# Patient Record
Sex: Male | Born: 1950 | Race: White | Hispanic: No | Smoking: Former smoker
Health system: Southern US, Community
[De-identification: ages and names within clinical notes are randomized; demographics above are authoritative.]

## PROBLEM LIST (undated history)

## (undated) DIAGNOSIS — E7800 Pure hypercholesterolemia, unspecified: Secondary | ICD-10-CM

## (undated) DIAGNOSIS — I2510 Atherosclerotic heart disease of native coronary artery without angina pectoris: Secondary | ICD-10-CM

## (undated) DIAGNOSIS — M545 Low back pain: Secondary | ICD-10-CM

## (undated) DIAGNOSIS — L309 Dermatitis, unspecified: Secondary | ICD-10-CM

## (undated) DIAGNOSIS — I219 Acute myocardial infarction, unspecified: Secondary | ICD-10-CM

## (undated) DIAGNOSIS — G8929 Other chronic pain: Secondary | ICD-10-CM

## (undated) HISTORY — PX: HERNIA REPAIR: SHX51

## (undated) HISTORY — PX: COLONOSCOPY: SHX174

## (undated) HISTORY — PX: TONSILLECTOMY: SUR1361

---

## 2014-04-30 HISTORY — PX: CARDIAC CATHETERIZATION: SHX172

## 2016-10-01 ENCOUNTER — Ambulatory Visit: Payer: Self-pay | Admitting: Physician Assistant

## 2016-11-02 ENCOUNTER — Encounter (HOSPITAL_COMMUNITY): Payer: Self-pay

## 2016-11-02 ENCOUNTER — Encounter (HOSPITAL_COMMUNITY)
Admission: RE | Admit: 2016-11-02 | Discharge: 2016-11-02 | Disposition: A | Discharge status: Home / Self Care | Payer: Worker's Compensation | Source: Ambulatory Visit | Attending: Orthopedic Surgery | Admitting: Orthopedic Surgery

## 2016-11-02 DIAGNOSIS — I2510 Atherosclerotic heart disease of native coronary artery without angina pectoris: Secondary | ICD-10-CM | POA: Insufficient documentation

## 2016-11-02 DIAGNOSIS — E785 Hyperlipidemia, unspecified: Secondary | ICD-10-CM | POA: Diagnosis not present

## 2016-11-02 DIAGNOSIS — Z79899 Other long term (current) drug therapy: Secondary | ICD-10-CM | POA: Diagnosis not present

## 2016-11-02 DIAGNOSIS — Z87891 Personal history of nicotine dependence: Secondary | ICD-10-CM | POA: Diagnosis not present

## 2016-11-02 DIAGNOSIS — Z01810 Encounter for preprocedural cardiovascular examination: Secondary | ICD-10-CM | POA: Diagnosis not present

## 2016-11-02 DIAGNOSIS — Z01812 Encounter for preprocedural laboratory examination: Secondary | ICD-10-CM | POA: Diagnosis not present

## 2016-11-02 HISTORY — DX: Low back pain: M54.5

## 2016-11-02 HISTORY — DX: Acute myocardial infarction, unspecified: I21.9

## 2016-11-02 HISTORY — DX: Other chronic pain: G89.29

## 2016-11-02 HISTORY — DX: Atherosclerotic heart disease of native coronary artery without angina pectoris: I25.10

## 2016-11-02 HISTORY — DX: Dermatitis, unspecified: L30.9

## 2016-11-02 HISTORY — DX: Pure hypercholesterolemia, unspecified: E78.00

## 2016-11-02 LAB — BASIC METABOLIC PANEL
ANION GAP: 5 (ref 5–15)
BUN: 12 mg/dL (ref 6–20)
CALCIUM: 9.1 mg/dL (ref 8.9–10.3)
CO2: 28 mmol/L (ref 22–32)
CREATININE: 0.92 mg/dL (ref 0.61–1.24)
Chloride: 106 mmol/L (ref 101–111)
GFR calc non Af Amer: >60 mL/min (ref >60)
Glucose, Bld: 95 mg/dL (ref 65–99)
Potassium: 4.2 mmol/L (ref 3.5–5.1)
Sodium: 139 mmol/L (ref 135–145)

## 2016-11-02 LAB — TYPE AND SCREEN
ABO/RH(D): A POS
Antibody Screen: NEGATIVE

## 2016-11-02 LAB — CBC
HEMATOCRIT: 44.1 % (ref 39.0–52.0)
HEMOGLOBIN: 14.8 g/dL (ref 13.0–17.0)
MCH: 32 pg (ref 26.0–34.0)
MCHC: 33.6 g/dL (ref 30.0–36.0)
MCV: 95.5 fL (ref 78.0–100.0)
Platelets: 270 10*3/uL (ref 150–400)
RBC: 4.62 MIL/uL (ref 4.22–5.81)
RDW: 13.3 % (ref 11.5–15.5)
WBC: 6.6 10*3/uL (ref 4.0–10.5)

## 2016-11-02 LAB — SURGICAL PCR SCREEN
MRSA, PCR: NEGATIVE
STAPHYLOCOCCUS AUREUS: NEGATIVE

## 2016-11-02 LAB — ABO/RH: ABO/RH(D): A POS

## 2016-11-02 NOTE — Progress Notes (Signed)
PCP - Ivor Messierhristopher Thomas Kelly Cardiologist - Dr. Almyra DeforestMcGurkin  Chest x-ray - not needed EKG - 11/02/16 Stress Test -requesting  ECHO - requesting Cardiac Cath - requesting  Sending to anesthesia for records     Patient denies shortness of breath, fever, cough and chest pain at PAT appointment   Patient verbalized understanding of instructions that were given to them at the PAT appointment. Patient was also instructed that they will need to review over the PAT instructions again at home before surgery.

## 2016-11-02 NOTE — Pre-Procedure Instructions (Signed)
    Zachary Wilkinson  11/02/2016     No Pharmacies Listed   Your procedure is scheduled on June 13  [facility] Admitting at [employer] A.M.  Call this number if you have problems the morning of surgery:  [phone]   Remember:  Do not eat food or drink liquids after midnight.   Take these medicines the morning of surgery with A SIP OF WATER  carvedilol (COREG), isosorbide mononitrate (IMDUR)   7 days prior to surgery STOP taking any Aspirin, Aleve, Naproxen, Ibuprofen, Motrin, Advil, Goody's, BC's, all herbal medications, fish oil, and all vitamins    Do not wear jewelry  Do not wear lotions, powders, or cologne, or deoderant.  Do not shave 48 hours prior to surgery.   Do not bring valuables to the hospital.  [facility] is not responsible for any belongings or valuables.  Contacts, dentures or bridgework may not be worn into surgery.  Leave your suitcase in the car.  After surgery it may be brought to your room.  For patients admitted to the hospital, discharge time will be determined by your treatment team.  Patients discharged the day of surgery will not be allowed to drive home.    Special instructions:   [place]- Preparing For Surgery  Before surgery, you can play an important role. Because skin is not sterile, your skin needs to be as free of germs as possible. You can reduce the number of germs on your skin by washing with CHG (chlorahexidine gluconate) Soap before surgery.  CHG is an antiseptic cleaner which kills germs and bonds with the skin to continue killing germs even after washing.  Please do not use if you have an allergy to CHG or antibacterial soaps. If your skin becomes reddened/irritated stop using the CHG.  Do not shave (including legs and underarms) for at least 48 hours prior to first CHG shower. It is OK to shave your face.  Please follow these instructions carefully.   1. Shower the NIGHT BEFORE SURGERY and the MORNING OF SURGERY  with CHG.   2. If you chose to wash your hair, wash your hair first as usual with your normal shampoo.  3. After you shampoo, rinse your hair and body thoroughly to remove the shampoo.  4. Use CHG as you would any other liquid soap. You can apply CHG directly to the skin and wash gently with a scrungie or a clean washcloth.   5. Apply the CHG Soap to your body ONLY FROM THE NECK DOWN.  Do not use on open wounds or open sores. Avoid contact with your eyes, ears, mouth and genitals (private parts). Wash genitals (private parts) with your normal soap.  6. Wash thoroughly, paying special attention to the area where your surgery will be performed.  7. Thoroughly rinse your body with warm water from the neck down.  8. DO NOT shower/wash with your normal soap after using and rinsing off the CHG Soap.  9. Pat yourself dry with a CLEAN TOWEL.   10. Wear CLEAN PAJAMAS   11. Place CLEAN SHEETS on your bed the night of your first shower and DO NOT SLEEP WITH PETS.    Day of Surgery: Do not apply any deodorants/lotions. Please wear clean clothes to the hospital/surgery center.      Please read over the following fact sheets that you were given.

## 2016-11-03 NOTE — Progress Notes (Signed)
Anesthesia Chart Review:  Patient is a 66 year old male scheduled for L3-5 XLIF, L3-S1 PSFI on 11/10/2016 with Venita Lickahari Brooks, MD  - PCP is Layla Mawhristopher Kelly, M.D. (notes in care everywhere)  - Cardiologist is Ginger CarneJames McGukin, MD who cleared pt for surgery at last office visit 10/04/16 (notes in care everywhere)   PMH includes:  CAD ([place] to [place]CA [place] hyperlipidemia. Former smoker. BMI 25.  Medications include: ASA 81 mg, Lipitor, carvedilol, Imdur  Preoperative labs 11/02/16 reviewed and acceptable for surgery.   EKG 11/02/16: NSR.   Exercise Treadmill Test 08/07/14 [facility] Normal stress test. ST segment change is within normal range.  If no changes, I anticipate pt can proceed with surgery as scheduled.   Rica Mastngela Ebert Forrester, FNP-BC [facility]/Anesthesiology Phone: [phone] 11/03/2016 2:42 PM

## 2016-11-09 MED ORDER — CEFAZOLIN SODIUM-DEXTROSE 2-4 GM/100ML-% IV SOLN
2.0000 g | INTRAVENOUS | Status: AC
  Administered 2016-11-10 (×2): 2 g via INTRAVENOUS
  Filled 2016-11-09: qty 100

## 2016-11-09 NOTE — Anesthesia Preprocedure Evaluation (Addendum)
Anesthesia Evaluation  Patient identified by MRN, date of birth, ID band Patient awake    Reviewed: Allergy & Precautions, NPO status , Patient's Chart, lab work & pertinent test results, reviewed documented beta blocker date and time   History of Anesthesia Complications Negative for: history of anesthetic complications  Airway Mallampati: II  TM Distance: >3 FB Neck ROM: Full    Dental  (+) Missing, Poor Dentition, Dental Advisory Given, Edentulous Lower   Pulmonary COPD, former smoker (quit 2015),    breath sounds clear to auscultation       Cardiovascular hypertension, Pt. on medications and Pt. on home beta blockers (-) angina+ CAD and + Cardiac Stents (DES to RCA 2015)   Rhythm:Regular Rate:Normal  '16 Stress test: normal without ST changes   Neuro/Psych Chronic back pain    GI/Hepatic negative GI ROS, Neg liver ROS,   Endo/Other  negative endocrine ROS  Renal/GU negative Renal ROS     Musculoskeletal   Abdominal   Peds  Hematology negative hematology ROS (+)   Anesthesia Other Findings   Reproductive/Obstetrics                            Anesthesia Physical Anesthesia Plan  ASA: III  Anesthesia Plan: General   Post-op Pain Management:    Induction: Intravenous  PONV Risk Score and Plan: 3 and Ondansetron, Dexamethasone, Propofol and Midazolam  Airway Management Planned: Oral ETT  Additional Equipment:   Intra-op Plan:   Post-operative Plan: Extubation in OR  Informed Consent: I have reviewed the patients History and Physical, chart, labs and discussed the procedure including the risks, benefits and alternatives for the proposed anesthesia with the patient or authorized representative who has indicated his/her understanding and acceptance.   Dental advisory given  Plan Discussed with: CRNA and Surgeon  Anesthesia Plan Comments: (Plan routine monitors, GETA)         Anesthesia Quick Evaluation

## 2016-11-10 ENCOUNTER — Encounter (HOSPITAL_COMMUNITY): Payer: Self-pay

## 2016-11-10 ENCOUNTER — Inpatient Hospital Stay (HOSPITAL_COMMUNITY)
Admission: RE | Admit: 2016-11-10 | Discharge: 2016-11-12 | DRG: 458 | Disposition: A | Discharge status: Home / Self Care | Payer: Worker's Compensation | Source: Ambulatory Visit | Attending: Orthopedic Surgery | Admitting: Orthopedic Surgery

## 2016-11-10 ENCOUNTER — Inpatient Hospital Stay (HOSPITAL_COMMUNITY): Payer: Worker's Compensation

## 2016-11-10 ENCOUNTER — Inpatient Hospital Stay (HOSPITAL_COMMUNITY): Payer: Worker's Compensation | Admitting: Emergency Medicine

## 2016-11-10 ENCOUNTER — Encounter (HOSPITAL_COMMUNITY)
Admission: RE | Disposition: A | Discharge status: Home / Self Care | Payer: Self-pay | Source: Ambulatory Visit | Attending: Orthopedic Surgery

## 2016-11-10 ENCOUNTER — Inpatient Hospital Stay (HOSPITAL_COMMUNITY): Payer: Worker's Compensation | Admitting: Anesthesiology

## 2016-11-10 DIAGNOSIS — G8929 Other chronic pain: Secondary | ICD-10-CM | POA: Diagnosis present

## 2016-11-10 DIAGNOSIS — M545 Low back pain: Secondary | ICD-10-CM | POA: Diagnosis present

## 2016-11-10 DIAGNOSIS — M5441 Lumbago with sciatica, right side: Secondary | ICD-10-CM | POA: Diagnosis present

## 2016-11-10 DIAGNOSIS — M549 Dorsalgia, unspecified: Secondary | ICD-10-CM | POA: Diagnosis present

## 2016-11-10 DIAGNOSIS — Z79899 Other long term (current) drug therapy: Secondary | ICD-10-CM

## 2016-11-10 DIAGNOSIS — M4186 Other forms of scoliosis, lumbar region: Secondary | ICD-10-CM | POA: Diagnosis present

## 2016-11-10 DIAGNOSIS — I2510 Atherosclerotic heart disease of native coronary artery without angina pectoris: Secondary | ICD-10-CM | POA: Diagnosis present

## 2016-11-10 DIAGNOSIS — E7800 Pure hypercholesterolemia, unspecified: Secondary | ICD-10-CM | POA: Diagnosis present

## 2016-11-10 DIAGNOSIS — Z419 Encounter for procedure for purposes other than remedying health state, unspecified: Secondary | ICD-10-CM

## 2016-11-10 DIAGNOSIS — M5137 Other intervertebral disc degeneration, lumbosacral region: Secondary | ICD-10-CM | POA: Diagnosis present

## 2016-11-10 DIAGNOSIS — M48062 Spinal stenosis, lumbar region with neurogenic claudication: Secondary | ICD-10-CM | POA: Diagnosis present

## 2016-11-10 DIAGNOSIS — Z7982 Long term (current) use of aspirin: Secondary | ICD-10-CM | POA: Diagnosis not present

## 2016-11-10 DIAGNOSIS — Z87891 Personal history of nicotine dependence: Secondary | ICD-10-CM | POA: Diagnosis not present

## 2016-11-10 DIAGNOSIS — M4326 Fusion of spine, lumbar region: Secondary | ICD-10-CM

## 2016-11-10 DIAGNOSIS — L309 Dermatitis, unspecified: Secondary | ICD-10-CM | POA: Diagnosis present

## 2016-11-10 DIAGNOSIS — I252 Old myocardial infarction: Secondary | ICD-10-CM

## 2016-11-10 DIAGNOSIS — R339 Retention of urine, unspecified: Secondary | ICD-10-CM | POA: Diagnosis not present

## 2016-11-10 DIAGNOSIS — M5136 Other intervertebral disc degeneration, lumbar region: Secondary | ICD-10-CM | POA: Diagnosis present

## 2016-11-10 HISTORY — PX: ANTERIOR LATERAL LUMBAR FUSION WITH PERCUTANEOUS SCREW 2 LEVEL: SHX5554

## 2016-11-10 SURGERY — ANTERIOR LATERAL LUMBAR FUSION WITH PERCUTANEOUS SCREW 2 LEVEL
Anesthesia: General | Site: Spine Lumbar

## 2016-11-10 MED ORDER — HEMOSTATIC AGENTS (NO CHARGE) OPTIME
TOPICAL | Status: DC | PRN
  Administered 2016-11-10 (×3): 1 via TOPICAL

## 2016-11-10 MED ORDER — MIDAZOLAM HCL 2 MG/2ML IJ SOLN
0.5000 mg | Freq: Once | INTRAMUSCULAR | Status: AC | PRN
  Administered 2016-11-10: 1 mg via INTRAVENOUS

## 2016-11-10 MED ORDER — BUPIVACAINE-EPINEPHRINE 0.25% -1:200000 IJ SOLN
INTRAMUSCULAR | Status: DC | PRN
  Administered 2016-11-10: 10 mL

## 2016-11-10 MED ORDER — MIDAZOLAM HCL 2 MG/2ML IJ SOLN
INTRAMUSCULAR | Status: AC
  Filled 2016-11-10: qty 2

## 2016-11-10 MED ORDER — METHOCARBAMOL 500 MG PO TABS
500.0000 mg | ORAL_TABLET | Freq: Four times a day (QID) | ORAL | Status: DC | PRN
  Administered 2016-11-10 – 2016-11-12 (×6): 500 mg via ORAL
  Filled 2016-11-10 (×6): qty 1

## 2016-11-10 MED ORDER — ACETAMINOPHEN 325 MG PO TABS
650.0000 mg | ORAL_TABLET | ORAL | Status: DC | PRN
  Administered 2016-11-11: 650 mg via ORAL
  Filled 2016-11-10: qty 2

## 2016-11-10 MED ORDER — PROMETHAZINE HCL 25 MG/ML IJ SOLN: 6.2500 mg | INTRAMUSCULAR | Status: DC | PRN

## 2016-11-10 MED ORDER — HYDROMORPHONE HCL 1 MG/ML IJ SOLN
0.2500 mg | INTRAMUSCULAR | Status: DC | PRN
  Administered 2016-11-10 (×3): 0.5 mg via INTRAVENOUS
  Administered 2016-11-10 (×2): 0.25 mg via INTRAVENOUS

## 2016-11-10 MED ORDER — OXYCODONE HCL 5 MG PO TABS
10.0000 mg | ORAL_TABLET | ORAL | Status: DC | PRN
  Administered 2016-11-10 – 2016-11-11 (×3): 10 mg via ORAL
  Filled 2016-11-10 (×3): qty 2

## 2016-11-10 MED ORDER — DEXAMETHASONE SODIUM PHOSPHATE 4 MG/ML IJ SOLN
4.0000 mg | Freq: Four times a day (QID) | INTRAMUSCULAR | Status: AC
  Administered 2016-11-10 – 2016-11-11 (×2): 4 mg via INTRAVENOUS
  Filled 2016-11-10 (×2): qty 1

## 2016-11-10 MED ORDER — PROPOFOL 10 MG/ML IV BOLUS
INTRAVENOUS | Status: DC | PRN
  Administered 2016-11-10: 130 mg via INTRAVENOUS

## 2016-11-10 MED ORDER — PROPOFOL 500 MG/50ML IV EMUL
INTRAVENOUS | Status: DC | PRN
  Administered 2016-11-10: 75 ug/kg/min via INTRAVENOUS

## 2016-11-10 MED ORDER — HYDROMORPHONE HCL 1 MG/ML IJ SOLN
INTRAMUSCULAR | Status: AC
  Filled 2016-11-10: qty 0.5

## 2016-11-10 MED ORDER — LIDOCAINE 2% (20 MG/ML) 5 ML SYRINGE
INTRAMUSCULAR | Status: DC | PRN
Start: 2016-11-10 — End: 2016-11-10
  Administered 2016-11-10: 80 mg via INTRAVENOUS

## 2016-11-10 MED ORDER — CEFAZOLIN SODIUM 1 G IJ SOLR
INTRAMUSCULAR | Status: AC
  Filled 2016-11-10: qty 20

## 2016-11-10 MED ORDER — DEXAMETHASONE 4 MG PO TABS
4.0000 mg | ORAL_TABLET | Freq: Four times a day (QID) | ORAL | Status: AC
  Administered 2016-11-11: 4 mg via ORAL
  Filled 2016-11-10: qty 1

## 2016-11-10 MED ORDER — FENTANYL CITRATE (PF) 250 MCG/5ML IJ SOLN
INTRAMUSCULAR | Status: AC
  Filled 2016-11-10: qty 5

## 2016-11-10 MED ORDER — POLYETHYLENE GLYCOL 3350 17 G PO PACK: 17.0000 g | PACK | Freq: Every day | ORAL | Status: DC | PRN

## 2016-11-10 MED ORDER — CEFAZOLIN SODIUM-DEXTROSE 2-4 GM/100ML-% IV SOLN
2.0000 g | Freq: Three times a day (TID) | INTRAVENOUS | Status: AC
  Administered 2016-11-10 – 2016-11-11 (×2): 2 g via INTRAVENOUS
  Filled 2016-11-10 (×2): qty 100

## 2016-11-10 MED ORDER — ONDANSETRON HCL 4 MG/2ML IJ SOLN: 4.0000 mg | Freq: Four times a day (QID) | INTRAMUSCULAR | Status: DC | PRN

## 2016-11-10 MED ORDER — METHOCARBAMOL 1000 MG/10ML IJ SOLN
500.0000 mg | Freq: Four times a day (QID) | INTRAVENOUS | Status: DC | PRN
  Filled 2016-11-10: qty 5

## 2016-11-10 MED ORDER — ONDANSETRON HCL 4 MG/2ML IJ SOLN
INTRAMUSCULAR | Status: AC
  Filled 2016-11-10: qty 2

## 2016-11-10 MED ORDER — MORPHINE SULFATE (PF) 4 MG/ML IV SOLN
2.0000 mg | INTRAVENOUS | Status: AC | PRN
  Administered 2016-11-10: 2 mg via INTRAVENOUS
  Filled 2016-11-10: qty 1

## 2016-11-10 MED ORDER — PROPOFOL 10 MG/ML IV BOLUS
INTRAVENOUS | Status: AC
  Filled 2016-11-10: qty 20

## 2016-11-10 MED ORDER — PHENOL 1.4 % MT LIQD: 1.0000 | OROMUCOSAL | Status: DC | PRN

## 2016-11-10 MED ORDER — DEXAMETHASONE SODIUM PHOSPHATE 10 MG/ML IJ SOLN
INTRAMUSCULAR | Status: AC
  Filled 2016-11-10: qty 1

## 2016-11-10 MED ORDER — MIDAZOLAM HCL 5 MG/5ML IJ SOLN
INTRAMUSCULAR | Status: DC | PRN
  Administered 2016-11-10: 2 mg via INTRAVENOUS

## 2016-11-10 MED ORDER — ISOSORBIDE MONONITRATE ER 60 MG PO TB24
60.0000 mg | ORAL_TABLET | Freq: Every day | ORAL | Status: DC
  Administered 2016-11-10 – 2016-11-12 (×3): 60 mg via ORAL
  Filled 2016-11-10 (×3): qty 1

## 2016-11-10 MED ORDER — SODIUM CHLORIDE 0.9% FLUSH: 3.0000 mL | INTRAVENOUS | Status: DC | PRN

## 2016-11-10 MED ORDER — ONDANSETRON HCL 4 MG/2ML IJ SOLN
INTRAMUSCULAR | Status: DC | PRN
  Administered 2016-11-10: 4 mg via INTRAVENOUS

## 2016-11-10 MED ORDER — 0.9 % SODIUM CHLORIDE (POUR BTL) OPTIME
TOPICAL | Status: DC | PRN
  Administered 2016-11-10 (×2): 1000 mL

## 2016-11-10 MED ORDER — MAGNESIUM CITRATE PO SOLN
0.5000 | Freq: Once | ORAL | Status: AC
  Administered 2016-11-11: 0.5 via ORAL
  Filled 2016-11-10: qty 296

## 2016-11-10 MED ORDER — EPHEDRINE SULFATE-NACL 50-0.9 MG/10ML-% IV SOSY
PREFILLED_SYRINGE | INTRAVENOUS | Status: DC | PRN
  Administered 2016-11-10: 10 mg via INTRAVENOUS
  Administered 2016-11-10: 5 mg via INTRAVENOUS

## 2016-11-10 MED ORDER — THROMBIN 20000 UNITS EX SOLR
CUTANEOUS | Status: AC
  Filled 2016-11-10: qty 20000

## 2016-11-10 MED ORDER — ATORVASTATIN CALCIUM 20 MG PO TABS
80.0000 mg | ORAL_TABLET | Freq: Every day | ORAL | Status: DC
  Administered 2016-11-10 – 2016-11-11 (×2): 80 mg via ORAL
  Filled 2016-11-10 (×2): qty 4

## 2016-11-10 MED ORDER — ACETAMINOPHEN 650 MG RE SUPP: 650.0000 mg | RECTAL | Status: DC | PRN

## 2016-11-10 MED ORDER — LIDOCAINE 2% (20 MG/ML) 5 ML SYRINGE
INTRAMUSCULAR | Status: AC
  Filled 2016-11-10: qty 5

## 2016-11-10 MED ORDER — HYDROXYZINE HCL 25 MG PO TABS: 50.0000 mg | ORAL_TABLET | Freq: Three times a day (TID) | ORAL | Status: DC | PRN

## 2016-11-10 MED ORDER — FENTANYL CITRATE (PF) 100 MCG/2ML IJ SOLN: INTRAMUSCULAR | Status: DC | PRN

## 2016-11-10 MED ORDER — ONDANSETRON HCL 4 MG PO TABS: 4.0000 mg | ORAL_TABLET | Freq: Four times a day (QID) | ORAL | Status: DC | PRN

## 2016-11-10 MED ORDER — MENTHOL 3 MG MT LOZG: 1.0000 | LOZENGE | OROMUCOSAL | Status: DC | PRN

## 2016-11-10 MED ORDER — SUCCINYLCHOLINE CHLORIDE 200 MG/10ML IV SOSY
PREFILLED_SYRINGE | INTRAVENOUS | Status: DC | PRN
Start: 2016-11-10 — End: 2016-11-10
  Administered 2016-11-10: 120 mg via INTRAVENOUS

## 2016-11-10 MED ORDER — LACTATED RINGERS IV SOLN
INTRAVENOUS | Status: DC
Start: 2016-11-10 — End: 2016-11-12

## 2016-11-10 MED ORDER — LACTATED RINGERS IV SOLN
INTRAVENOUS | Status: DC | PRN
  Administered 2016-11-10 (×3): via INTRAVENOUS

## 2016-11-10 MED ORDER — CARVEDILOL 3.125 MG PO TABS
3.1250 mg | ORAL_TABLET | Freq: Two times a day (BID) | ORAL | Status: DC
  Administered 2016-11-10 – 2016-11-12 (×4): 3.125 mg via ORAL
  Filled 2016-11-10 (×3): qty 1

## 2016-11-10 MED ORDER — FENTANYL CITRATE (PF) 250 MCG/5ML IJ SOLN
INTRAMUSCULAR | Status: DC | PRN
  Administered 2016-11-10: 100 ug via INTRAVENOUS
  Administered 2016-11-10: 150 ug via INTRAVENOUS
  Administered 2016-11-10: 50 ug via INTRAVENOUS

## 2016-11-10 MED ORDER — DEXAMETHASONE SODIUM PHOSPHATE 10 MG/ML IJ SOLN
INTRAMUSCULAR | Status: DC | PRN
  Administered 2016-11-10: 10 mg via INTRAVENOUS

## 2016-11-10 MED ORDER — BUPIVACAINE-EPINEPHRINE (PF) 0.25% -1:200000 IJ SOLN
INTRAMUSCULAR | Status: AC
  Filled 2016-11-10: qty 30

## 2016-11-10 MED ORDER — SODIUM CHLORIDE 0.9% FLUSH
3.0000 mL | Freq: Two times a day (BID) | INTRAVENOUS | Status: DC
Start: 2016-11-10 — End: 2016-11-12
  Administered 2016-11-11: 3 mL via INTRAVENOUS

## 2016-11-10 MED ORDER — PHENYLEPHRINE HCL 10 MG/ML IJ SOLN
INTRAMUSCULAR | Status: DC | PRN
  Administered 2016-11-10: 20 ug/min via INTRAVENOUS

## 2016-11-10 MED ORDER — PHENYLEPHRINE 40 MCG/ML (10ML) SYRINGE FOR IV PUSH (FOR BLOOD PRESSURE SUPPORT)
PREFILLED_SYRINGE | INTRAVENOUS | Status: DC | PRN
  Administered 2016-11-10 (×2): 80 ug via INTRAVENOUS
  Administered 2016-11-10: 40 ug via INTRAVENOUS
  Administered 2016-11-10: 80 ug via INTRAVENOUS

## 2016-11-10 MED ORDER — DOCUSATE SODIUM 100 MG PO CAPS
100.0000 mg | ORAL_CAPSULE | Freq: Two times a day (BID) | ORAL | Status: DC
  Administered 2016-11-10 – 2016-11-12 (×4): 100 mg via ORAL
  Filled 2016-11-10 (×4): qty 1

## 2016-11-10 MED ORDER — FLEET ENEMA 7-19 GM/118ML RE ENEM: 1.0000 | ENEMA | Freq: Once | RECTAL | Status: DC

## 2016-11-10 MED ORDER — PROPOFOL 1000 MG/100ML IV EMUL
INTRAVENOUS | Status: AC
  Filled 2016-11-10: qty 300

## 2016-11-10 MED ORDER — EPHEDRINE 5 MG/ML INJ
INTRAVENOUS | Status: AC
  Filled 2016-11-10: qty 10

## 2016-11-10 MED ORDER — MEPERIDINE HCL 25 MG/ML IJ SOLN: 6.2500 mg | INTRAMUSCULAR | Status: DC | PRN

## 2016-11-10 MED ORDER — PHENYLEPHRINE 40 MCG/ML (10ML) SYRINGE FOR IV PUSH (FOR BLOOD PRESSURE SUPPORT)
PREFILLED_SYRINGE | INTRAVENOUS | Status: AC
  Filled 2016-11-10: qty 10

## 2016-11-10 MED ORDER — CARVEDILOL 3.125 MG PO TABS
3.1250 mg | ORAL_TABLET | Freq: Two times a day (BID) | ORAL | Status: DC
  Administered 2016-11-10: 3.125 mg via ORAL
  Filled 2016-11-10 (×2): qty 1

## 2016-11-10 MED ORDER — SUCCINYLCHOLINE CHLORIDE 200 MG/10ML IV SOSY
PREFILLED_SYRINGE | INTRAVENOUS | Status: AC
  Filled 2016-11-10: qty 10

## 2016-11-10 SURGICAL SUPPLY — 114 items
APPLIER CLIP 11 MED OPEN (CLIP)
BLADE CLIPPER SURG (BLADE) IMPLANT
BLADE SURG 10 STRL SS (BLADE) ×6 IMPLANT
BONE MATRIX OSTEOCEL PRO MED (Bone Implant) ×6 IMPLANT
BONE VIVIGEN FORMABLE 5.4CC (Bone Implant) ×3 IMPLANT
BUR EGG ELITE 4.0 (BURR) ×2 IMPLANT
BUR EGG ELITE 4.0MM (BURR) ×1
CAGE MODULUS XL 12X18X55 - 10 (Cage) ×3 IMPLANT
CLIP APPLIE 11 MED OPEN (CLIP) IMPLANT
CLIP NEUROVISION LG (CLIP) ×3 IMPLANT
CLOSURE STERI-STRIP 1/2X4 (GAUZE/BANDAGES/DRESSINGS) ×3
CLOSURE WOUND 1/2 X4 (GAUZE/BANDAGES/DRESSINGS)
CLSR STERI-STRIP ANTIMIC 1/2X4 (GAUZE/BANDAGES/DRESSINGS) ×6 IMPLANT
CORDS BIPOLAR (ELECTRODE) ×6 IMPLANT
COUNTER NEEDLE 20 DBL MAG RED (NEEDLE) ×3 IMPLANT
COVER MAYO STAND STRL (DRAPES) ×6 IMPLANT
COVER SURGICAL LIGHT HANDLE (MISCELLANEOUS) ×9 IMPLANT
DERMABOND ADVANCED (GAUZE/BANDAGES/DRESSINGS)
DERMABOND ADVANCED .7 DNX12 (GAUZE/BANDAGES/DRESSINGS) IMPLANT
DRAPE C-ARM 42X72 X-RAY (DRAPES) ×6 IMPLANT
DRAPE C-ARMOR (DRAPES) ×6 IMPLANT
DRAPE INCISE IOBAN 66X45 STRL (DRAPES) ×6 IMPLANT
DRAPE LAPAROTOMY T 102X78X121 (DRAPES) ×6 IMPLANT
DRAPE LAPAROTOMY TRNSV 102X78 (DRAPE) ×3 IMPLANT
DRAPE ORTHO SPLIT 77X108 STRL (DRAPES) ×2
DRAPE POUCH INSTRU U-SHP 10X18 (DRAPES) ×3 IMPLANT
DRAPE SURG 17X23 STRL (DRAPES) ×3 IMPLANT
DRAPE SURG ORHT 6 SPLT 77X108 (DRAPES) ×1 IMPLANT
DRAPE U-SHAPE 47X51 STRL (DRAPES) ×6 IMPLANT
DRSG AQUACEL AG ADV 3.5X 6 (GAUZE/BANDAGES/DRESSINGS) ×9 IMPLANT
DRSG AQUACEL AG ADV 3.5X10 (GAUZE/BANDAGES/DRESSINGS) ×3 IMPLANT
DURAPREP 26ML APPLICATOR (WOUND CARE) ×6 IMPLANT
ELECT BLADE 4.0 EZ CLEAN MEGAD (MISCELLANEOUS) ×3
ELECT BLADE 6.5 EXT (BLADE) IMPLANT
ELECT CAUTERY BLADE 6.4 (BLADE) IMPLANT
ELECT PENCIL ROCKER SW 15FT (MISCELLANEOUS) ×6 IMPLANT
ELECT REM PT RETURN 9FT ADLT (ELECTROSURGICAL) ×3
ELECTRODE BLDE 4.0 EZ CLN MEGD (MISCELLANEOUS) ×1 IMPLANT
ELECTRODE REM PT RTRN 9FT ADLT (ELECTROSURGICAL) ×1 IMPLANT
GAUZE SPONGE 4X4 16PLY XRAY LF (GAUZE/BANDAGES/DRESSINGS) IMPLANT
GLOVE BIO SURGEON STRL SZ 6.5 (GLOVE) ×6 IMPLANT
GLOVE BIO SURGEONS STRL SZ 6.5 (GLOVE) ×3
GLOVE BIOGEL PI IND STRL 6.5 (GLOVE) ×1 IMPLANT
GLOVE BIOGEL PI IND STRL 8.5 (GLOVE) ×3 IMPLANT
GLOVE BIOGEL PI INDICATOR 6.5 (GLOVE) ×2
GLOVE BIOGEL PI INDICATOR 8.5 (GLOVE) ×6
GLOVE SS BIOGEL STRL SZ 8.5 (GLOVE) ×1 IMPLANT
GLOVE SUPERSENSE BIOGEL SZ 8.5 (GLOVE) ×2
GOWN STRL REUS W/ TWL LRG LVL3 (GOWN DISPOSABLE) ×1 IMPLANT
GOWN STRL REUS W/TWL 2XL LVL3 (GOWN DISPOSABLE) ×6 IMPLANT
GOWN STRL REUS W/TWL LRG LVL3 (GOWN DISPOSABLE) ×2
GUIDEWIRE NITINOL BEVEL TIP (WIRE) ×24 IMPLANT
KIT BASIN OR (CUSTOM PROCEDURE TRAY) ×3 IMPLANT
KIT DILATOR XLIF 5 (KITS) ×1 IMPLANT
KIT POSITION SURG JACKSON T1 (MISCELLANEOUS) ×3 IMPLANT
KIT ROOM TURNOVER OR (KITS) ×3 IMPLANT
KIT SURGICAL ACCESS MAXCESS 4 (KITS) ×3 IMPLANT
KIT XLIF (KITS) ×2
MODULE NVM5 NEXT GEN EMG (NEEDLE) ×3 IMPLANT
MODULUS XLW 12X22X55MM 10 (Spine Construct) ×3 IMPLANT
NEEDLE 22X1 1/2 (OR ONLY) (NEEDLE) ×3 IMPLANT
NEEDLE I-PASS III (NEEDLE) ×3 IMPLANT
NEEDLE SPNL 18GX3.5 QUINCKE PK (NEEDLE) ×3 IMPLANT
NS IRRIG 1000ML POUR BTL (IV SOLUTION) ×9 IMPLANT
PACK LAMINECTOMY ORTHO (CUSTOM PROCEDURE TRAY) ×3 IMPLANT
PACK UNIVERSAL I (CUSTOM PROCEDURE TRAY) ×6 IMPLANT
PAD ARMBOARD 7.5X6 YLW CONV (MISCELLANEOUS) ×6 IMPLANT
PATTIES SURGICAL .5 X.5 (GAUZE/BANDAGES/DRESSINGS) IMPLANT
PATTIES SURGICAL .5 X1 (DISPOSABLE) IMPLANT
POSITIONER HEAD PRONE TRACH (MISCELLANEOUS) ×3 IMPLANT
PROBE BALL TIP NVM5 SNG USE (BALLOONS) ×3 IMPLANT
PUTTY BONE DBX 5CC MIX (Putty) ×3 IMPLANT
PUTTY DBX 1CC (Putty) ×3 IMPLANT
PUTTY DBX 1CC DEPUY (Putty) ×1 IMPLANT
REDUCTION EXT RELINE MAS MOD (Neuro Prosthesis/Implant) ×12 IMPLANT
ROD RELINE MAS LORD 5.5X85MM (Rod) ×3 IMPLANT
ROD RELINE MAS LORD 5.5X90MM (Rod) ×3 IMPLANT
SCREW LOCK RELINE 5.5 TULIP (Screw) ×24 IMPLANT
SCREW RED MAS POLY 7.5X40MM (Screw) ×3 IMPLANT
SCREW RELINE RED 6.5X45MM POLY (Screw) ×15 IMPLANT
SCREW SHANK RELINE 7.5X40MM 2C (Screw) ×6 IMPLANT
SCREW SHANK RELINE MOD 7.5X45 (Screw) ×4 IMPLANT
SCREW SHANK RLINE MD 7.5X45 2C (Screw) ×2 IMPLANT
SLEEVE SURGEON STRL (DRAPES) ×3 IMPLANT
SPONGE INTESTINAL PEANUT (DISPOSABLE) ×3 IMPLANT
SPONGE LAP 4X18 X RAY DECT (DISPOSABLE) ×15 IMPLANT
SPONGE SURGIFOAM ABS GEL 100 (HEMOSTASIS) IMPLANT
STRIP CLOSURE SKIN 1/2X4 (GAUZE/BANDAGES/DRESSINGS) IMPLANT
SURGIFLO W/THROMBIN 8M KIT (HEMOSTASIS) ×3 IMPLANT
SUT BONE WAX W31G (SUTURE) ×3 IMPLANT
SUT MNCRL AB 3-0 PS2 18 (SUTURE) ×9 IMPLANT
SUT MON AB 3-0 SH 27 (SUTURE) ×2
SUT MON AB 3-0 SH27 (SUTURE) ×1 IMPLANT
SUT PROLENE 5 0 C 1 24 (SUTURE) IMPLANT
SUT SILK 2 0 TIES 10X30 (SUTURE) IMPLANT
SUT SILK 3 0 TIES 10X30 (SUTURE) IMPLANT
SUT VIC AB 1 CT1 18XCR BRD 8 (SUTURE) ×3 IMPLANT
SUT VIC AB 1 CT1 27 (SUTURE)
SUT VIC AB 1 CT1 27XBRD ANBCTR (SUTURE) IMPLANT
SUT VIC AB 1 CT1 8-18 (SUTURE) ×6
SUT VIC AB 1 CTX 36 (SUTURE)
SUT VIC AB 1 CTX36XBRD ANBCTR (SUTURE) IMPLANT
SUT VIC AB 2-0 CT1 18 (SUTURE) ×15 IMPLANT
SYR BULB IRRIGATION 50ML (SYRINGE) ×3 IMPLANT
SYR CONTROL 10ML LL (SYRINGE) ×9 IMPLANT
TAPE CLOTH 4X10 WHT NS (GAUZE/BANDAGES/DRESSINGS) ×6 IMPLANT
TOWEL OR 17X24 6PK STRL BLUE (TOWEL DISPOSABLE) ×3 IMPLANT
TOWEL OR 17X26 10 PK STRL BLUE (TOWEL DISPOSABLE) ×6 IMPLANT
TRAY FOLEY CATH SILVER 16FR (SET/KITS/TRAYS/PACK) ×3 IMPLANT
TRAY FOLEY W/METER SILVER 16FR (SET/KITS/TRAYS/PACK) ×3 IMPLANT
TUBE CONNECTING 12'X1/4 (SUCTIONS) ×1
TUBE CONNECTING 12X1/4 (SUCTIONS) ×2 IMPLANT
WATER STERILE IRR 1000ML POUR (IV SOLUTION) IMPLANT
YANKAUER SUCT BULB TIP NO VENT (SUCTIONS) ×6 IMPLANT

## 2016-11-10 NOTE — Anesthesia Postprocedure Evaluation (Signed)
Anesthesia Post Note  Patient: Zachary Wilkinson  Procedure(s) Performed: Procedure(s) (LRB): Extreme lumbar interbody fusion Lumbar 3-5 (N/A) Posterior Fusion  Lumbar 3-Sacral 1 (N/A)     Patient location during evaluation: PACU Anesthesia Type: General Level of consciousness: sedated, patient cooperative and oriented Pain management: pain level controlled Vital Signs Assessment: post-procedure vital signs reviewed and stable Respiratory status: spontaneous breathing, nonlabored ventilation, respiratory function stable and patient connected to nasal cannula oxygen Cardiovascular status: blood pressure returned to baseline and stable Postop Assessment: no signs of nausea or vomiting Anesthetic complications: no    Last Vitals:  Vitals:   11/10/16 1750 11/10/16 1814  BP:  111/67  Pulse:  100  Resp:  18  Temp: 37.1 C 36.4 C    Last Pain:  Vitals:   11/10/16 1715  TempSrc:   PainSc: Asleep      LLE Sensation: Full sensation (11/10/16 1834)   RLE Sensation: Full sensation (11/10/16 1834)      Erling CruzJACKSON,E. Chimene Salo

## 2016-11-10 NOTE — Brief Op Note (Signed)
11/10/2016  2:49 PM  PATIENT:  Zachary Wilkinson  66 y.o. male  PRE-OPERATIVE DIAGNOSIS:  Degenerative scoliosis with stenosis, DDD lumbar  POST-OPERATIVE DIAGNOSIS:  Degenerative scoliosis with stenosis, DDD lumbar  PROCEDURE:  Procedure(s): Extreme lumbar interbody fusion Lumbar 3-5 (N/A) Posterior Fusion  Lumbar 3-Sacral 1 (N/A)  SURGEON:  Surgeon(s) and Role:    Venita Lick* Aundraya Dripps, MD - Primary  PHYSICIAN ASSISTANT:   ASSISTANTS: carmen mayo   ANESTHESIA:   general  EBL:  Total I/O In: 2000 [I.V.:2000] Out: 750 [Urine:500; Blood:250]  BLOOD ADMINISTERED:none  DRAINS: none   LOCAL MEDICATIONS USED:  MARCAINE     SPECIMEN:  No Specimen  DISPOSITION OF SPECIMEN:  N/A  COUNTS:  YES  TOURNIQUET:  * No tourniquets in log *  DICTATION: .Other Dictation: Dictation Number [id number]  PLAN OF CARE: Admit to inpatient   PATIENT DISPOSITION:  PACU - hemodynamically stable.

## 2016-11-10 NOTE — H&P (Signed)
History of Present Illness The patient is a 66 year old male who comes in today for a preoperative History and Physical. The patient is scheduled for a XLIF L3-5, PSFI L3-S1 to be performed by Dr. Debria Garretahari D. Shon BatonBrooks, [facility] at [facility] on 11-10-16 . Please see the hospital record for complete dictated history and physical. the pt reports hx of a MI in 2015. Pt reports he stopped smoking after MI.   Problem List/Past Medical  Cervical pain (M54.2)  Acute bilateral low back pain with right-sided sciatica (M54.41)  Problems Reconciled   Allergies  No Known Drug Allergies  Allergies Reconciled   Family History  Cancer  Father. Cerebrovascular Accident  Father. Rheumatoid Arthritis  Father, Mother.  Social History  Tobacco use  Former smoker. Children  1 Current drinker  10/30/2015: Currently drinks beer only occasionally per week Current work status  working full time Living situation  live with partner Marital status  divorced No history of drug/alcohol rehab  Not under pain contract  Number of flights of stairs before winded  less than 1  Medication History  Atorvastatin Calcium (80MG  Tablet, Oral) Active. Carvedilol (3.125MG  Tablet, Oral) Active. Isosorbide Mononitrate ER (30MG  Tablet ER 24HR, Oral) Active. Aspirin (81MG  Tablet, Oral) Active. Medications Reconciled  Vitals  09/07/2016 9:37 AM Weight: 166 lb Height: 68in Body Surface Area: 1.89 m Body Mass Index: 25.24 kg/m  Pulse: 81 (Regular)  BP: 141/92 (Sitting, Left Arm, Standard) w/shoes smt General General Appearance-Not in acute distress. Orientation-Oriented X3. Build & Nutrition-Well nourished and Well developed.  Integumentary General Characteristics Surgical Scars - no surgical scar evidence of previous lumbar surgery. Lumbar Spine-Skin examination of the lumbar spine is without deformity, skin lesions, lacerations or abrasions.  Chest and Lung  Exam Auscultation Breath sounds - Normal and Clear.  Cardiovascular Auscultation Rhythm - Regular rate and rhythm.  Abdomen Palpation/Percussion Palpation and Percussion of the abdomen reveal - Soft, Non Tender and No Rebound tenderness.  Peripheral Vascular Lower Extremity Palpation - Posterior tibial pulse - Bilateral - 2+. Dorsalis pedis pulse - Bilateral - 2+.  Neurologic Sensation Lower Extremity - Bilateral - sensation is intact in the lower extremity. Reflexes Patellar Reflex - Bilateral - 2+. Achilles Reflex - Bilateral - 2+. Testing Seated Straight Leg Raise - Bilateral - Seated straight leg raise negative.  Musculoskeletal Spine/Ribs/Pelvis  Lumbosacral Spine: Inspection and Palpation - Tenderness - left lumbar paraspinals tender to palpation and right lumbar paraspinals tender to palpation. Strength and Tone: Strength - Hip Flexion - Bilateral - 5/5. Knee Extension - Bilateral - 5/5. Knee Flexion - Bilateral - 5/5. Ankle Dorsiflexion - Bilateral - 5/5. Ankle Plantarflexion - Bilateral - 5/5. Heel walk - Bilateral - able to heel walk without difficulty. Toe Walk - Bilateral - able to walk on toes without difficulty. Heel-Toe Walk - Bilateral - able to heel-toe walk without difficulty. ROM - Flexion - moderately decreased range of motion and painful. Extension - moderately decreased range of motion and painful. Left Lateral Bending - moderately decreased range of motion and painful. Right Lateral Bending - moderately decreased range of motion and painful. Right Rotation - moderately decreased range of motion and painful. Left Rotation - full range of motion. Pain - neither flexion or extension is more painful than the other. Lumbosacral Spine - Waddell's Signs - no Waddell's signs present. Lower Extremity Range of Motion - No true hip, knee or ankle pain with range of motion. Gait and Station - BankerAssistive Devices - no assistive  devices.    Objective Transcription On clinical  exam, he continues to have horrific back pain radiating into the buttock and down into both legs. His principal problem is the back pain though. He has loss of motion. He has pain with palpation. He has no focal motor deficits, 5/5 strength in the lower extremities. Intermittent dysesthesias in the buttock and hamstring and quad region. No hip flexor weakness. No hip, knee, or ankle pain with joint range of motion. No loss of bowel or bladder control.  RADIOGRAPHS Upon evaluation of his new x-rays today 09/07/2016, his pelvic incidence to lumbar lordosis is 15.1. This is greater than the acceptable 13 degrees. This indicates that his sagittal balance is quite poor and is contributing to his overall pain syndrome. It is my opinion that because of such an excessive pelvic incidence and lumbar lordosis ratio that doing a straightforward decompression alone would not adequately treat his pathology. In addition to the loss of his sagittal balance, he also has a scoliosis. This altered coronal balance is also contributing to his pain. He has approximately a 10 degree local kyphotic curve from L2 to L5.     Assessment & Plan Spinal stenosis of lumbar region with neurogenic claudication   Goal Of Surgery: Discussed that goal of surgery is to reduce pain and improve function and quality of life. Patient is aware that despite all appropriate treatment that there pain and function could be the same, worse, or different.  Lateral Lumbar Fusion: Risksof surgery include, but are not limited to: Death, stroke, paralysis, nerve root damage/injury, bleeding, blood clots, loss of bowel/bladder control, sexual dysfunction, retrograde ejaculation, hardware failure, or mal-position, spinal fluid leak, adjacent segment disease, non-union, need for further surgery, ongoing or worse pain, and injury to bladder, bowel and abdominal contents, infection. Injury to the lumbar plexus resulting in temporary or permanent hip  flexor weakness and difficulty walking. Need for supplemental posterior surgery including decompression and need for screw fixation.  Posterior decompression/Fusion:Risks of surgery include infection, bleeding, nerve damage, death, stroke, paralysis, failure to heal, need for further surgery, ongoing or worse pain, need for further surgery, CSF leak, loss of bowel or bladder, and recurrent disc herniation or stenosis which would necessitate need for further surgery. Non-union, hardware failure, adjacent segment disease and recurrent pain. Hardware breakage, mal-position requiring surgery to correct or remove.  Plans Transcription At this point, my concern with a straightforward decompression is that the sagittal balance will not improve and his coronal scoliosis deformity will worsen because of the decompression. Given these factors, I think the best course of action is the instrumented fusion. The L3-4 and L4-5 interbody fixation would allow to address the lateral recess and central stenosis. It allows [place] to address the coronal and sagittal imbalance and we will give him the best chance of a better outcome. Again, I would recommend the XLIF at L3-4 and L4-5 with posterior L3 to S1 instrumented fusion. I have reviewed the risks with him, which include infection, bleeding, nerve damage, death, stroke, paralysis, failure to heal, need for further surgery, ongoing or worse pain, loss of fixation, nonunion, need for further surgery, injury to the lumbar plexus, which can create hip weakness and difficulty ambulating. All of his questions were addressed. We will plan on moving forward with surgery in the very near future.

## 2016-11-10 NOTE — Transfer of Care (Signed)
Immediate Anesthesia Transfer of Care Note  Patient: Zachary Wilkinson  Procedure(s) Performed: Procedure(s): Extreme lumbar interbody fusion Lumbar 3-5 (N/A) Posterior Fusion  Lumbar 3-Sacral 1 (N/A)  Patient Location: PACU  Anesthesia Type:General  Level of Consciousness: drowsy and patient cooperative  Airway & Oxygen Therapy: Patient Spontanous Breathing and Patient connected to face mask oxygen  Post-op Assessment: Report given to RN, Post -op Vital signs reviewed and stable and Patient moving all extremities  Post vital signs: Reviewed and stable  Last Vitals:  Vitals:   11/10/16 0644  BP: (!) 148/73  Pulse: 69  Resp: 20  Temp: 36.8 C    Last Pain:  Vitals:   11/10/16 0644  TempSrc: Oral  PainSc: 6       Patients Stated Pain Goal: 3 (11/10/16 0644)  Complications: No apparent anesthesia complications

## 2016-11-10 NOTE — Anesthesia Procedure Notes (Signed)
Procedure Name: Intubation Date/Time: 11/10/2016 8:40 AM Performed by: Freddie Breech Pre-anesthesia Checklist: Patient identified, Emergency Drugs available, Suction available and Patient being monitored Patient Re-evaluated:Patient Re-evaluated prior to inductionOxygen Delivery Method: Circle System Utilized Preoxygenation: Pre-oxygenation with 100% oxygen Intubation Type: IV induction Ventilation: Mask ventilation without difficulty Laryngoscope Size: Mac and 4 Grade View: Grade I Tube type: Oral Number of attempts: 1 Airway Equipment and Method: Stylet and Oral airway Placement Confirmation: ETT inserted through vocal cords under direct vision,  positive ETCO2 and breath sounds checked- equal and bilateral Secured at: 23 cm Tube secured with: Tape Dental Injury: Teeth and Oropharynx as per pre-operative assessment

## 2016-11-11 ENCOUNTER — Inpatient Hospital Stay (HOSPITAL_COMMUNITY): Payer: Worker's Compensation

## 2016-11-11 ENCOUNTER — Encounter (HOSPITAL_COMMUNITY): Payer: Self-pay | Admitting: Orthopedic Surgery

## 2016-11-11 MED ORDER — ONDANSETRON 4 MG PO TBDP: 4.0000 mg | ORAL_TABLET | Freq: Three times a day (TID) | ORAL | 0 refills | Status: AC | PRN

## 2016-11-11 MED ORDER — PANTOPRAZOLE SODIUM 40 MG PO TBEC
40.0000 mg | DELAYED_RELEASE_TABLET | Freq: Two times a day (BID) | ORAL | Status: DC
  Administered 2016-11-11 – 2016-11-12 (×3): 40 mg via ORAL
  Filled 2016-11-11 (×3): qty 1

## 2016-11-11 MED ORDER — METHOCARBAMOL 500 MG PO TABS: 500.0000 mg | ORAL_TABLET | Freq: Three times a day (TID) | ORAL | 0 refills | Status: AC

## 2016-11-11 MED ORDER — ALUM & MAG HYDROXIDE-SIMETH 200-200-20 MG/5ML PO SUSP
30.0000 mL | Freq: Four times a day (QID) | ORAL | Status: DC | PRN
  Administered 2016-11-11 – 2016-11-12 (×2): 30 mL via ORAL
  Filled 2016-11-11 (×2): qty 30

## 2016-11-11 MED ORDER — PANTOPRAZOLE SODIUM 40 MG PO TBEC: 40.0000 mg | DELAYED_RELEASE_TABLET | Freq: Two times a day (BID) | ORAL | Status: DC

## 2016-11-11 MED ORDER — TAMSULOSIN HCL 0.4 MG PO CAPS
0.4000 mg | ORAL_CAPSULE | Freq: Every day | ORAL | Status: DC
  Administered 2016-11-11 – 2016-11-12 (×2): 0.4 mg via ORAL
  Filled 2016-11-11 (×2): qty 1

## 2016-11-11 MED ORDER — OXYCODONE HCL 5 MG PO TABS
15.0000 mg | ORAL_TABLET | ORAL | Status: DC | PRN
  Administered 2016-11-11 – 2016-11-12 (×7): 15 mg via ORAL
  Filled 2016-11-11 (×7): qty 3

## 2016-11-11 MED ORDER — OXYCODONE HCL 15 MG PO TABS: 15.0000 mg | ORAL_TABLET | ORAL | 0 refills | Status: AC | PRN

## 2016-11-11 NOTE — Progress Notes (Signed)
Discussed the pts urinary retention with Dr. Shon BatonBrooks.  He wanted to test rectal tone and sensation to r/o cauda equina syndrome.  This was performed and the pt demonstrated good rectal tone and sensation.  I already started Flomax.  We will continue to monitor today.  Anette Riedelarmen Mayo, [facility]

## 2016-11-11 NOTE — Op Note (Signed)
Zachary Wilkinson, Zachary Wilkinson               ACCOUNT NO.:  [id number]  MEDICAL RECORD NO.:  [id number]  LOCATION:                                 FACILITY:  PHYSICIAN:  Jhaden Pizzuto D. Shon Baton, M.D. 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  OPERATIVE PROCEDURES: 1. XLIF L3-4, L4-5. 2. Posterior instrumented fusion, L3-S1 with posterior lateral     arthrodesis, L5-S1.  COMPLICATIONS:  None.  CONDITION:  Stable.  Intraoperative neuromonitoring throughout the case remained normal with no adverse abnormal neuromonitoring events.  IMPLANT SYSTEM USED:  NuVasive titanium XLIF cage Modulus XL 12 x 22 x 55 at L3-4 and the Modulus XL 12 x 18 x 55 at L4-5.  Both were packed with Osteocel and DBX.  Posteriorly, we used cannulated MIS pedicle screws 45 mm in length at L3, 4, and 5; 40 mm in length at S1; 6.5 diameter L3 and L4 screws; 7.5 diameter screws at L5 and S1; posterolateral arthrodesis completed with ViviGen and DBX mix.  FIRST ASSISTANT:  Anette Riedel, [place].  HISTORY:  This is a very pleasant 66 year old gentleman who has been having severe back, buttock, and bilateral neuropathic leg pain. Attempts at conservative management had failed to alleviate his symptoms and he continued to suffer.  Preoperative imaging demonstrated loss of normal sagittal balance with a preop pelvic incidence to lumbar lordosis of 15, significant spinal stenosis.  As a result of the failure of conservative management, we elected to proceed with surgery.  The patient had also had significant degenerative disk disease at L5-S1. After discussing all the risks and benefits of surgery, we elected to proceed with the aforementioned  procedure.  DESCRIPTION OF PROCEDURE:  The patient was brought to the operating room and placed supine on the operating table.  After successful induction of general anesthesia and endotracheal intubation, TEDs, SCDs, and a Foley were inserted, and the intraoperative neuromonitoring apparatus was applied.  He was then turned into the left lateral decubitus position (left side up).  Axillary roll was placed.  Bony prominences were well padded, and the patient was secured to the Skytron operative bed using tape.  Once he was firmly attached to the bed, I confirmed in the AP and lateral planes that we centered at the L4-5 and L3-4 levels.  The lateral aspect of the flank was then prepped and draped in a standard fashion.  Operative time-out was taken and the consent was reviewed and all other pertinent important data was reviewed.  Using x-ray, identified the midportion of the L4 vertebral body.  I then marked this out.  So, I mapped out the anterior and posterior margins of the L4 vertebral body.  I infiltrated the proposed skin incision site and made an incision.  Sharp dissection was carried out down to the fascia of the external oblique.  A second small incision was made one fingerbreadth posteriorly and I bluntly dissected down to the posterior aspect of the  retroperitoneum.  I then bluntly dissected into the retroperitoneum and I could now palpate the iliopsoas.  I then began dissecting in the retroperitoneum with my bluntly and then dissected through the undersurface of the external and internal oblique.  Once I could deliver my finger into the primary incision site, I placed the first dilating tube on it and brought it down to the surface of the psoas.  I stimulated the surface of the psoas to ensure that I was not compressing the plexus.  I then advanced the trocar through the psoas to the lateral aspect of the L4-5 disk space.  I made sure to be in the anterior third of the  disk space.  I then stimulated in all directions circumferentially to confirm that I was not compressing the lumbar plexus.  I then sequentially dilated up and with each dilatation, confirming that I was not endangering or compressing the lumbar plexus. There were no aberrant free-running EMGs throughout that portion of the case.  I then placed my working trocar down and then slowly maneuvered it posteriorly.  I made sure that I had a good cuff of muscle, so that I was not directly compressing or traumatizing the lumbar plexus.  Again, I stimulated and based on the stimulation, the lumbar plexus was posterior to my retraction blades.  At this point, I had excellent visualization of the lateral aspect of the disk space.  Once all the trocar was secured to the table and to the disk space, an annulotomy was performed and I removed all of the disk material using pituitary rongeurs and cutting curettes.  I then released the contralateral annulus with an osteotome.  At this point, I had removed all of the cartilaginous endplate and had adequate diskectomy.  I then trialed sequentially the implants, then elected to use the size 12 x 18 x 55 lordotic cage.  This provided excellent interbody positioning.  It also provided excellent distraction and correction of the scoliosis.  I obtained the graft, packed it with the allograft bone and then malleted into position.  I then released all of the retractors.  This portion of the case represented about 23 minutes of retraction time.  I was pleased and I then paused for about 5 minutes to decrease the risk of neurapraxia and lumbar plexus traction injury.  At this point, once I had allowed 5 minutes to pass, I confirmed hemostasis and then repositioned and then using the same technique I had used at L4-5, placed my retracting blades down onto the L3-4 disk.  Again, I made sure the lumbar plexus was posterior to my blade.  With the lateral aspect of the  disk space visualized, I again performed an annulotomy and then used curettes and Kerrison rongeurs to remove all of the disk material and a Cobb elevator to release the contralateral side.  At this time, once I had removed all of the cartilaginous endplate, I placed a 12 x 22 x 55, 10-degree lordotic cage.  Again, I was pleased with the overall distraction and repositioning.  Once this was complete, I then rechecked my pelvic incidence to lumbar lordosis.  Preoperatively, it was 15 and post XLIF, it was 0.  This was, in my estimation, excellent restoration of the sagittal imbalance.  At this point, I irrigated the wound copiously with normal saline, made sure hemostasis using bipolar electrocautery and closed the fascia with interrupted #1 Vicryl sutures, then a layer of 2-0 Vicryl sutures and 3-0 Monocryl.  The secondary  incision I made posteriorly was irrigated and closed in a similar fashion.  With the lateral surgery complete, the patient was returned into the supine position and the spine Jean Rosenthal frame was brought into the room. The patient was gently repositioned into the prone position onto the spine frame.  Again, all bony prominences were well padded and the back now was prepped and draped in a standard fashion.  A secondary time-out was taken again just to confirm that we were doing the posterior portion, which was an L3-S1 instrumented fusion with posterolateral arthrodesis at L5-S1.  At this point, with the patient now prepped and draped, an x-ray was brought into the field and I identified the L3 pedicle.  A small incision was made on the lateral aspect of this pedicle and I advanced the Jamshidi needle down to the lateral aspect of the pedicle.  I attached the neurostimulating device to the Jamshidi needle, and using the AP view, I advanced the Jamshidi needle down into the pedicle of L3. Once I was nearing the medial wall of that pedicle, I switched my view to the  lateral.  I was just beyond the posterior wall of the vertebral body confirming satisfactory position and trajectory of my Jamshidi.  I advanced it into the pedicle and then placed my guidepin.  The L3 pedicle was now properly cannulated.  I repeated this exact procedure at L4, L5, and S1 and on the contralateral side.  Once all 8 pedicles were properly cannulated, I then began the instrumentation __________ fusion. Pedicle screws were then sized and then I placed the pedicle screws 7.5 diameter 40 mm length at S1 and a 7.5 diameter 45 mm length at L5.  Both the screws had excellent purchase.  I directly stimulated both screws and there was no aberrant free-running EMGs with 40 milliamps of stimulation.  Once this was completed, I connected the retracting device to the L5 and the S1 screws, and I could now see the posterolateral aspect of the spine.  I gently mobilized the paraspinal muscles to expose the facet capsule at L4-5.  I completely disrupted this facet capsule using a curette and bur.  With the facet burred and the posterolateral aspect burred, I then packed the posterolateral corner and the facet with bone graft.  This was ViviGen with DBX mix.  Once the posterolateral L5-S1 gutter was packed, I then attached the polyaxial heads to the screws.  I then placed the percutaneous L4 and L3 screws. Again, all 8 screws were then directly stimulated and there was no aberrant free-running EMG activity with 40 milliamps of stimulation. Once this was completed, I then measured and then passed a rod.  On the left-hand side, I used 90 mm length rod.  On the right-hand side, I used an 85 mm length rod.  The rod was passed and made sure to pass through all 4 screws on each side.  I then secured the rod down with top locking nuts.  They were all then torqued off according to manufacture's standards.  Final x-rays were now taken, AP and lateral views, which demonstrated satisfactory position of  the XLIF cage as well as the posterior pedicle screw fixation at L3, L4, L5 and S1.  All wounds were then copiously irrigated with normal saline and then closed in a layered fashion with interrupted #1 Vicryl sutures, 2-0 Vicryl sutures, and a 3- 0 Monocryl.  Steri-Strips and dry dressings were applied, and the patient was ultimately extubated, transferred to the  PACU without incident.  At the end of the case, all needle and sponge counts were correct.  There were no adverse intraoperative events.     Tishara Pizano D. Shon Baton, M.D.   ______________________________ Donn Pierini. Shon Baton, M.D.    DDB/MEDQ  D:  11/10/2016  T:  11/10/2016  Job:  [id number]

## 2016-11-11 NOTE — Evaluation (Signed)
Occupational Therapy Evaluation and Discharge Patient Details Name: Zachary CalixRonnie Teasdale MRN: [id number] DOB: 03/05/1951 Today's Date: 11/11/2016    History of Present Illness Pt is a 66 y.o. male s/p L3-5 XLIF, L3-S1 Posterior fusion. No pertinent PMHx in chart.   Clinical Impression   Pt reports he was independent with ADL PTA. Currently pt overall min guard assist for functional mobility and min assist for LB ADL. All back, safety, and ADL education completed with pt. Pt planning to d/c home with 24/7 supervision from family. No further acute OT needs identified; signing off at this time. Please re-consult if needs change. Thank you for this referral.    Follow Up Recommendations  No OT follow up;Supervision/Assistance - 24 hour (initially)    Equipment Recommendations  3 in 1 bedside commode    Recommendations for Other Services PT consult     Precautions / Restrictions Precautions Precautions: Fall;Back Precaution Booklet Issued: Yes (comment) Precaution Comments: Educated pt and wife on back precautions Required Braces or Orthoses: Spinal Brace Spinal Brace: Lumbar corset Restrictions Weight Bearing Restrictions: No      Mobility Bed Mobility               General bed mobility comments: Pt OOB in bathroom upon arrival  Transfers Overall transfer level: Needs assistance Equipment used: Rolling walker (2 wheeled) Transfers: Sit to/from Stand Sit to Stand: Min guard         General transfer comment: Good hand placement and technique with RW. Min guard for safety    Balance Overall balance assessment: Needs assistance Sitting-balance support: No upper extremity supported;Feet supported Sitting balance-Leahy Scale: Good     Standing balance support: No upper extremity supported;During functional activity Standing balance-Leahy Scale: Fair                             ADL either performed or assessed with clinical judgement   ADL Overall ADL's :  Needs assistance/impaired Eating/Feeding: Set up;Sitting   Grooming: Supervision/safety;Standing Grooming Details (indicate cue type and reason): Educated on use of 2 cups for oral care Upper Body Bathing: Set up;Supervision/ safety;Sitting   Lower Body Bathing: Minimal assistance;Sit to/from stand   Upper Body Dressing : Set up;Supervision/safety;Sitting Upper Body Dressing Details (indicate cue type and reason): to don brace Lower Body Dressing: Minimal assistance;Sit to/from stand Lower Body Dressing Details (indicate cue type and reason): Pt with difficulty crossing foot over opposite knee. Educated on compensatory strategies. Wife planning to assist as needed Toilet Transfer: Min guard;Ambulation;BSC;RW Toilet Transfer Details (indicate cue type and reason): Educated on use of 3 in 1 over toilet   Toileting - Clothing Manipulation Details (indicate cue type and reason): Educated on proper technique for peri care without twisting and use of wet wipes Tub/ Shower Transfer: Min guard;Tub transfer;Ambulation;3 in 1 Tub/Shower Transfer Details (indicate cue type and reason): Educated on tub transfer technique and use of 3 in 1 as a seat. Pt able to demo simulated tub transfer in room with min guard assist Functional mobility during ADLs: Min guard;Rolling walker General ADL Comments: Educated pt on maintaining back precautions during functional activities, keeping frequently used items at counter top height, and frequent mobility throughout the day upon return home.     Vision         Perception     Praxis      Pertinent Vitals/Pain Pain Assessment: 0-10 Pain Score: 9  Pain Location: back Pain Descriptors / Indicators:  Aching;Sore Pain Intervention(s): Monitored during session;Repositioned     Hand Dominance     Extremity/Trunk Assessment Upper Extremity Assessment Upper Extremity Assessment: Overall WFL for tasks assessed   Lower Extremity Assessment Lower Extremity  Assessment: Defer to PT evaluation   Cervical / Trunk Assessment Cervical / Trunk Assessment: Other exceptions Cervical / Trunk Exceptions: s/p spinal sx   Communication Communication Communication: HOH   Cognition Arousal/Alertness: Awake/alert Behavior During Therapy: WFL for tasks assessed/performed Overall Cognitive Status: Within Functional Limits for tasks assessed                                     General Comments       Exercises     Shoulder Instructions      Home Living Family/patient expects to be discharged to:: Private residence Living Arrangements: Spouse/significant other Available Help at Discharge: Family;Available 24 hours/day Type of Home: House Home Access: Stairs to enter [employer] of Steps: 2   Home Layout: One level     Bathroom Shower/Tub: IT trainer: Standard     Home Equipment: None          Prior Functioning/Environment Level of Independence: Independent                 OT Problem List:        OT Treatment/Interventions:      OT Goals(Current goals can be found in the care plan section) Acute Rehab OT Goals Patient Stated Goal: return home OT Goal Formulation: All assessment and education complete, DC therapy  OT Frequency:     Barriers to D/C:            Co-evaluation              AM-PAC PT "6 Clicks" Daily Activity     Outcome Measure Help from another person eating meals?: None Help from another person taking care of personal grooming?: A Little Help from another person toileting, which includes using toliet, bedpan, or urinal?: A Little Help from another person bathing (including washing, rinsing, drying)?: A Little Help from another person to put on and taking off regular upper body clothing?: A Little Help from another person to put on and taking off regular lower body clothing?: A Little 6 Click Score: 19   End of Session Equipment Utilized  During Treatment: Rolling walker;Back brace Nurse Communication: Mobility status;Other (comment) (equipment needs)  Activity Tolerance: Patient tolerated treatment well Patient left: in chair;with call bell/phone within reach;with family/visitor present  OT Visit Diagnosis: Other abnormalities of gait and mobility (R26.89)                Time: 0802-0820 OT Time Calculation (min): 18 min Charges:  OT General Charges $OT Visit: 1 Procedure OT Evaluation $OT Eval Moderate Complexity: 1 Procedure G-Codes:     Lavra Imler A. Brett Albino, M.S., OTR/L Pager: [phone]  Gaye Alken 11/11/2016, 9:37 AM

## 2016-11-11 NOTE — Progress Notes (Addendum)
    Subjective: 1 Day Post-Op Procedure(s) (LRB): Extreme lumbar interbody fusion Lumbar 3-5 (N/A) Posterior Fusion  Lumbar 3-Sacral 1 (N/A) Patient reports pain as 9 on 0-10 scale.   Denies CP or SOB.  Pt having difficulty voiding. Positive flatus. Pt ambulating with use of walker  Objective: Vital signs in last 24 hours: Temp:  [97.5 F (36.4 C)-98.8 F (37.1 C)] 98.4 F (36.9 C) (06/14 0445) Pulse Rate:  [72-112] 92 (06/14 0445) Resp:  [8-21] 18 (06/14 0445) BP: (85-136)/(54-91) 133/81 (06/14 0445) SpO2:  [92 %-99 %] 95 % (06/14 0445)  Intake/Output from previous day: 06/13 0701 - 06/14 0700 In: 2300 [I.V.:2300] Out: 1925 [Urine:1675; Blood:250] Intake/Output this shift: No intake/output data recorded.  Labs: No results for input(s): HGB in the last 72 hours. No results for input(s): WBC, RBC, HCT, PLT in the last 72 hours. No results for input(s): NA, K, CL, CO2, BUN, CREATININE, GLUCOSE, CALCIUM in the last 72 hours. No results for input(s): LABPT, INR in the last 72 hours.  Physical Exam: Neurologically intact ABD soft Sensation intact distally Dorsiflexion/Plantar flexion intact Incision: no drainage Compartment soft  Assessment/Plan: 1 Day Post-Op Procedure(s) (LRB): Extreme lumbar interbody fusion Lumbar 3-5 (N/A) Posterior Fusion  Lumbar 3-Sacral 1 (N/A) Advance diet Up with therapy  Started Flomax  [facility], Baxter Kailarmen Christina for Dr. Venita Lickahari Hollyann [facility] [phone] 11/11/2016, 7:23 AM    Patient ID: Zachary Wilkinson, male   DOB: 04/09/1951, [age] y.o.   MRN: [id number]    Doing well Right thigh pain improving No evidence of cauda equina Hip flexor strength intact no evidence of plexus injury Continue mobilization Plan on d/c to home tomorrow

## 2016-11-11 NOTE — Evaluation (Signed)
Physical Therapy Evaluation Patient Details Name: Zachary Wilkinson MRN: [id number] DOB: 09/06/1950 Today's Date: 11/11/2016   History of Present Illness  Pt is a 66 y.o. male s/p L3-5 XLIF, L3-S1 Posterior fusion. No pertinent PMHx in chart.  Clinical Impression  Pt admitted with above diagnosis. Pt currently with functional limitations due to the deficits listed below (see PT Problem List). At the time of PT eval pt was able to perform transfers and ambulation with min guard assist grossly for balance support and safety. Pt will benefit from skilled PT to increase their independence and safety with mobility to allow discharge to the venue listed below.       Follow Up Recommendations No PT follow up;Supervision for mobility/OOB    Equipment Recommendations  Rolling walker with 5" wheels;3in1 (PT)    Recommendations for Other Services       Precautions / Restrictions Precautions Precautions: Fall;Back Precaution Booklet Issued: Yes (comment) Precaution Comments: Educated pt and wife on back precautions Required Braces or Orthoses: Spinal Brace Spinal Brace: Lumbar corset Restrictions Weight Bearing Restrictions: No      Mobility  Bed Mobility               General bed mobility comments: Pt OOB in chair upon arrival  Transfers Overall transfer level: Needs assistance Equipment used: Rolling walker (2 wheeled) Transfers: Sit to/from Stand Sit to Stand: Min guard         General transfer comment: Good hand placement and technique with RW. Min guard for safety  Ambulation/Gait Ambulation/Gait assistance: Min guard Ambulation Distance (Feet): 250 Feet Assistive device: Rolling walker (2 wheeled) Gait Pattern/deviations: Step-through pattern;Decreased stride length;Trunk flexed Gait velocity: Decreased Gait velocity interpretation: Below normal speed for age/gender General Gait Details: VC's for improved posture and general safety with the RW. Pt was able to ambulate  well with no unsteadiness or LOB noted while RW was being used.   Stairs Stairs: Yes Stairs assistance: Min guard Stair Management: One rail Right;Step to pattern;Forwards Number of Stairs: 3 (x2) General stair comments: VC's for sequencing and general safety.   Wheelchair Mobility    Modified Rankin (Stroke Patients Only)       Balance Overall balance assessment: Needs assistance Sitting-balance support: No upper extremity supported;Feet supported Sitting balance-Leahy Scale: Good     Standing balance support: No upper extremity supported;During functional activity Standing balance-Leahy Scale: Fair                               Pertinent Vitals/Pain Pain Assessment: 0-10 Pain Score: 8  Pain Location: back Pain Descriptors / Indicators: Aching;Sore Pain Intervention(s): Limited activity within patient's tolerance;Monitored during session;Repositioned    Home Living Family/patient expects to be discharged to:: Private residence Living Arrangements: Spouse/significant other Available Help at Discharge: Family;Available 24 hours/day Type of Home: House Home Access: Stairs to enter   [employer] Corporation[employer]trance Stairs-Number of Steps: 2 Home Layout: One level Home Equipment: None      Prior Function Level of Independence: Independent               Hand Dominance        Extremity/Trunk Assessment   Upper Extremity Assessment Upper Extremity Assessment: Defer to OT evaluation    Lower Extremity Assessment Lower Extremity Assessment: Generalized weakness    Cervical / Trunk Assessment Cervical / Trunk Assessment: Other exceptions Cervical / Trunk Exceptions: s/p spinal sx  Communication   Communication: HOH  Cognition Arousal/Alertness:  Awake/alert Behavior During Therapy: WFL for tasks assessed/performed Overall Cognitive Status: Within Functional Limits for tasks assessed                                        General Comments       Exercises     Assessment/Plan    PT Assessment Patient needs continued PT services  PT Problem List Decreased strength;Decreased range of motion;Decreased activity tolerance;Decreased balance;Decreased mobility;Decreased knowledge of use of DME;Decreased safety awareness;Decreased knowledge of precautions;Pain       PT Treatment Interventions DME instruction;Gait training;Stair training;Functional mobility training;Therapeutic activities;Therapeutic exercise;Neuromuscular re-education;Patient/family education    PT Goals (Current goals can be found in the Care Plan section)  Acute Rehab PT Goals Patient Stated Goal: return home PT Goal Formulation: With patient Time For Goal Achievement: 11/18/16 Potential to Achieve Goals: Good    Frequency Min 5X/week   Barriers to discharge        Co-evaluation               AM-PAC PT "6 Clicks" Daily Activity  Outcome Measure Difficulty turning over in bed (including adjusting bedclothes, sheets and blankets)?: A Little Difficulty moving from lying on back to sitting on the side of the bed? : A Little Difficulty sitting down on and standing up from a chair with arms (e.g., wheelchair, bedside commode, etc,.)?: A Little Help needed moving to and from a bed to chair (including a wheelchair)?: A Little Help needed walking in hospital room?: A Little Help needed climbing 3-5 steps with a railing? : A Little 6 Click Score: 18    End of Session Equipment Utilized During Treatment: Gait belt Activity Tolerance: Patient tolerated treatment well Patient left: in chair;with call bell/phone within reach Nurse Communication: Mobility status PT Visit Diagnosis: Unsteadiness on feet (R26.81);Pain    Time: 1610-9604 PT Time Calculation (min) (ACUTE ONLY): 19 min   Charges:   PT Evaluation $PT Eval Moderate Complexity: 1 Procedure     PT G Codes:        Zachary Wilkinson, PT, DPT Acute Rehabilitation Services Pager: [phone]    Zachary Wilkinson 11/11/2016, 10:38 AM

## 2016-11-12 ENCOUNTER — Encounter (HOSPITAL_COMMUNITY): Payer: Self-pay | Admitting: Orthopedic Surgery

## 2016-11-12 NOTE — Progress Notes (Signed)
Physical Therapy Treatment Patient Details Name: Zachary CalixRonnie Grindle MRN: [id number] DOB: 05/12/1951 Today's Date: 11/12/2016    History of Present Illness Pt is a 66 y.o. male s/p L3-5 XLIF, L3-S1 Posterior fusion. No pertinent PMHx in chart.    PT Comments    Pt demonstrated improved activity tolerance during today's session. Pt ambulated 33640ft with a RW and required VCs to maintain upright posture. PT provided education about safety with car transfers, and maintaining spinal precautions. Pt demonstrated good knowledge of spinal precautions and safety during mobility. Current plan of care remains appropriate.    Follow Up Recommendations  No PT follow up;Supervision - Intermittent     Equipment Recommendations  Rolling walker with 5" wheels    Recommendations for Other Services       Precautions / Restrictions Precautions Precautions: Back;Fall Precaution Comments: pt able to state 3/3 spinal precautions Required Braces or Orthoses: Spinal Brace Spinal Brace: Lumbar corset Restrictions Weight Bearing Restrictions: No    Mobility  Bed Mobility               General bed mobility comments: received in chair  Transfers Overall transfer level: Modified independent Equipment used: None Transfers: Sit to/from Stand Sit to Stand: Modified independent (Device/Increase time)         General transfer comment: Increased time due to pain  Ambulation/Gait Ambulation/Gait assistance: Supervision Ambulation Distance (Feet): 340 Feet Assistive device: Rolling walker (2 wheeled) Gait Pattern/deviations: Step-through pattern;Decreased stride length;Trunk flexed Gait velocity: decreased Gait velocity interpretation: Below normal speed for age/gender General Gait Details: Pt required VCs for upright posture during gait.    Stairs            Wheelchair Mobility    Modified Rankin (Stroke Patients Only)       Balance Overall balance assessment: Needs  assistance Sitting-balance support: No upper extremity supported;Feet supported Sitting balance-Leahy Scale: Good     Standing balance support: Bilateral upper extremity supported;During functional activity Standing balance-Leahy Scale: Fair                              Cognition Arousal/Alertness: Awake/alert Behavior During Therapy: WFL for tasks assessed/performed Overall Cognitive Status: Within Functional Limits for tasks assessed                                        Exercises      General Comments General comments (skin integrity, edema, etc.): Pt's wife present during session. PT provided edcuation about safety with car transfers and mobility, and maintaining spinal precautions      Pertinent Vitals/Pain Pain Assessment: Faces Faces Pain Scale: Hurts even more Pain Location: back Pain Descriptors / Indicators: Aching;Operative site guarding;Sore Pain Intervention(s): Monitored during session    Home Living                      Prior Function            PT Goals (current goals can now be found in the care plan section) Acute Rehab PT Goals Patient Stated Goal: to go home PT Goal Formulation: With patient Time For Goal Achievement: 11/18/16 Potential to Achieve Goals: Good Progress towards PT goals: Progressing toward goals    Frequency    Min 5X/week      PT Plan Current plan remains appropriate    Co-evaluation  AM-PAC PT "6 Clicks" Daily Activity  Outcome Measure  Difficulty turning over in bed (including adjusting bedclothes, sheets and blankets)?: None Difficulty moving from lying on back to sitting on the side of the bed? : None Difficulty sitting down on and standing up from a chair with arms (e.g., wheelchair, bedside commode, etc,.)?: None Help needed moving to and from a bed to chair (including a wheelchair)?: A Little Help needed walking in hospital room?: A Little Help needed  climbing 3-5 steps with a railing? : A Little 6 Click Score: 21    End of Session Equipment Utilized During Treatment: Gait belt;Back brace Activity Tolerance: Patient tolerated treatment well Patient left: in chair;with call bell/phone within reach;with family/visitor present Nurse Communication: Mobility status PT Visit Diagnosis: Unsteadiness on feet (R26.81);Pain Pain - part of body:  (back)     Time: 4098-1191 PT Time Calculation (min) (ACUTE ONLY): 11 min  Charges:  $Gait Training: 8-22 mins                    G Codes:       Corlis Leak, SPT  [phone]   Corlis Leak 11/12/2016, 9:59 AM

## 2016-11-12 NOTE — Progress Notes (Signed)
Patient is being discharged home. Discharge instructions were given to patient and family 

## 2016-11-12 NOTE — Progress Notes (Signed)
    Subjective: Procedure(s) (LRB): Extreme lumbar interbody fusion Lumbar 3-5 (N/A) Posterior Fusion  Lumbar 3-Sacral 1 (N/A) 2 Days Post-Op  Patient reports pain as 3 on 0-10 scale.  Reports decreased leg pain reports incisional back pain   Positive void Negative bowel movement Positive flatus Negative chest pain or shortness of breath  Objective: Vital signs in last 24 hours: Temp:  [97.7 F (36.5 C)-98.3 F (36.8 C)] 98.1 F (36.7 C) (06/15 0414) Pulse Rate:  [64-74] 73 (06/15 0414) Resp:  [18-20] 18 (06/15 0414) BP: (96-126)/(68-76) 111/76 (06/15 0414) SpO2:  [94 %-98 %] 94 % (06/15 0414)  Intake/Output from previous day: 06/14 0701 - 06/15 0700 In: 960 [P.O.:960] Out: 400 [Urine:400]  Labs: No results for input(s): WBC, RBC, HCT, PLT in the last 72 hours. No results for input(s): NA, K, CL, CO2, BUN, CREATININE, GLUCOSE, CALCIUM in the last 72 hours. No results for input(s): LABPT, INR in the last 72 hours.  Physical Exam: Neurologically intact ABD soft Intact pulses distally Incision: dressing C/D/I Compartment soft  Assessment/Plan: Patient stable  xrays satisfactory Continue mobilization with physical therapy Continue care  Advance diet Up with therapy  Plan on d/c to home today  Venita Lickahari Vielka Klinedinst, MD [facility] [phone]

## 2016-11-17 NOTE — Discharge Summary (Signed)
Physician Discharge Summary  Patient ID: Zachary Wilkinson MRN: [id number] DOB/AGE: [birth year] 64 y.o.  Admit date: 11/10/2016 Discharge date: 11/12/16  Admission Diagnoses:  <principal problem not specified>  Discharge Diagnoses:  Active Problems:   Back pain   Past Medical History:  Diagnosis Date  . Chronic low back pain   . Coronary artery disease   . Eczema   . High cholesterol   . Myocardial infarction [facility])     Surgeries: Procedure(s): Extreme lumbar interbody fusion Lumbar 3-5 Posterior Fusion  Lumbar 3-Sacral 1 on 11/10/2016   Consultants (if any):   Discharged Condition: Improved  Hospital Course: Zachary Wilkinson is an 66 y.o. male who was admitted 11/10/2016 with a diagnosis of Degenerative Lumbar Scoliosis and went to the operating room on 11/10/2016 and underwent the above named procedures.  Post op day one pts level of pain was high and he had urinary retention.  Post op day one pain level was low and controlled on oral medications.  He reports decreased leg pain. Pt is voiding. Pt is ambulating in hallway.  Pt is cleared by PT for DC.  Pt denies nausea.    He was given perioperative antibiotics:  Anti-infectives    Start     Dose/Rate Route Frequency Ordered Stop   11/10/16 2000  ceFAZolin (ANCEF) IVPB 2g/100 mL premix     2 g 200 mL/hr over 30 Minutes Intravenous Every 8 hours 11/10/16 1833 11/11/16 0406   11/10/16 0800  ceFAZolin (ANCEF) IVPB 2g/100 mL premix     2 g 200 mL/hr over 30 Minutes Intravenous To [facility] 11/09/16 1538 11/10/16 1305    .  He was given sequential compression devices, early ambulation, and TED for DVT prophylaxis.  He benefited maximally from the hospital stay and there were no complications.    Recent vital signs:  Vitals:   11/12/16 0414 11/12/16 0810  BP: 111/76 114/75  Pulse: 73 71  Resp: 18 18  Temp: 98.1 F (36.7 C) 97.6 F (36.4 C)    Recent laboratory studies:  Lab Results  Component Value Date   HGB  14.8 11/02/2016   Lab Results  Component Value Date   WBC 6.6 11/02/2016   PLT 270 11/02/2016   No results found for: INR Lab Results  Component Value Date   NA 139 11/02/2016   K 4.2 11/02/2016   CL 106 11/02/2016   CO2 28 11/02/2016   BUN 12 11/02/2016   CREATININE 0.92 11/02/2016   GLUCOSE 95 11/02/2016    Discharge Medications:   Allergies as of 11/12/2016      Reactions   No Known Allergies       Medication List    TAKE these medications   aspirin EC 81 MG tablet Take 81 mg by mouth daily.   atorvastatin 80 MG tablet Commonly known as:  LIPITOR Take 80 mg by mouth daily.   carvedilol 3.125 MG tablet Commonly known as:  COREG Take 3.125 mg by mouth 2 (two) times daily.   hydrOXYzine 50 MG tablet Commonly known as:  ATARAX/VISTARIL Take 50 mg by mouth every 8 (eight) hours as needed for itching.   isosorbide mononitrate 60 MG 24 hr tablet Commonly known as:  IMDUR Take 60 mg by mouth daily.   methocarbamol 500 MG tablet Commonly known as:  ROBAXIN Take 1 tablet (500 mg total) by mouth 3 (three) times daily.   ondansetron 4 MG disintegrating tablet Commonly known as:  ZOFRAN ODT Take 1 tablet (4  mg total) by mouth every 8 (eight) hours as needed for nausea or vomiting.   oxyCODONE 15 MG immediate release tablet Commonly known as:  ROXICODONE Take 1 tablet (15 mg total) by mouth every 4 (four) hours as needed for severe pain.   triamcinolone cream 0.1 % Commonly known as:  KENALOG Apply 1 application topically 2 (two) times daily as needed (for skin).       Diagnostic Studies: Dg Lumbar Spine 2-3 Views  Result Date: 11/11/2016 CLINICAL DATA:  66 year old male status post lumbar surgery yesterday. EXAM: LUMBAR SPINE - 2-3 VIEW COMPARISON:  Intraoperative fluoroscopic images of the lumbar spine yesterday. FINDINGS: Normal lumbar segmentation. Straightening of lumbar lordosis. Sequelae of L3 L4 and L4-L5 posterior and interbody fusion. Transpedicular  fusion hardware only at L5-S1. Hardware appears intact and placement appears stable since yesterday. No superimposed acute osseous abnormality identified. Severe disc space loss at L1-L2 (vacuum disc) and L5-S1 with endplate spurring. Visible lower thoracic levels appear intact. Sacral ala and SI joints appear normal. Calcified aortic atherosclerosis. IMPRESSION: 1. L3 through S1 fusion hardware placement, stable since yesterday with no adverse features. 2. Advanced L1-L2 disc and endplate degeneration. 3.  Calcified aortic atherosclerosis. Electronically Signed   By: Odessa FlemingH  Hall M.D.   On: 11/11/2016 08:01   Dg Lumbar Spine Complete  Result Date: 11/10/2016 CLINICAL DATA:  Status post surgical posterior fusion. EXAM: LUMBAR SPINE - COMPLETE 4+ VIEW; DG C-ARM GT 120 MIN FLUOROSCOPY TIME:  9 minutes 33 seconds. COMPARISON:  None. FINDINGS: Four intraoperative fluoroscopic images were obtained of the lower lumbar spine. These demonstrate the patient be status post surgical posterior fusion extending from L3-S1 with bilateral intrapedicular screw placement. Patient is also status post interbody fusion of L3-4 and L5-S1. Good alignment of vertebral bodies is noted. Atherosclerosis of thoracic aorta is noted. IMPRESSION: Status post surgical fusion extending from L3-S1. Electronically Signed   By: Lupita RaiderJames  Green Jr, M.D.   On: 11/10/2016 15:09   Dg C-arm Gt 120 Min  Result Date: 11/10/2016 CLINICAL DATA:  Status post surgical posterior fusion. EXAM: LUMBAR SPINE - COMPLETE 4+ VIEW; DG C-ARM GT 120 MIN FLUOROSCOPY TIME:  9 minutes 33 seconds. COMPARISON:  None. FINDINGS: Four intraoperative fluoroscopic images were obtained of the lower lumbar spine. These demonstrate the patient be status post surgical posterior fusion extending from L3-S1 with bilateral intrapedicular screw placement. Patient is also status post interbody fusion of L3-4 and L5-S1. Good alignment of vertebral bodies is noted. Atherosclerosis of  thoracic aorta is noted. IMPRESSION: Status post surgical fusion extending from L3-S1. Electronically Signed   By: Lupita RaiderJames  Green Jr, M.D.   On: 11/10/2016 15:09    Disposition: 01-Home or Self Care  The pt will present to clinic in 2 weeks Post op meds provided for the pt.  Discharge Instructions    Incentive spirometry RT    Complete by:  As directed       Follow-up Information    Venita LickBrooks, Dahari, MD Follow up.   Specialty:  Orthopedic Surgery Contact information: [address] [place] [address] [phone]            Signed: Kirt BoysMayo, Kiva Norland Christina 11/17/2016, 11:50 AM

## 2017-09-16 IMAGING — RF DG LUMBAR SPINE COMPLETE 4+V
1 series · 4 of 4 positions shown · non-contrast
Comparison: None.

CLINICAL DATA: Status post surgical posterior fusion.

EXAM:
LUMBAR SPINE - COMPLETE 4+ VIEW; DG C-ARM GT 120 MIN
FLUOROSCOPY TIME:  9 minutes 33 seconds.

[Series 1: run · 4 of 4 slices shown]
[im 1/4]
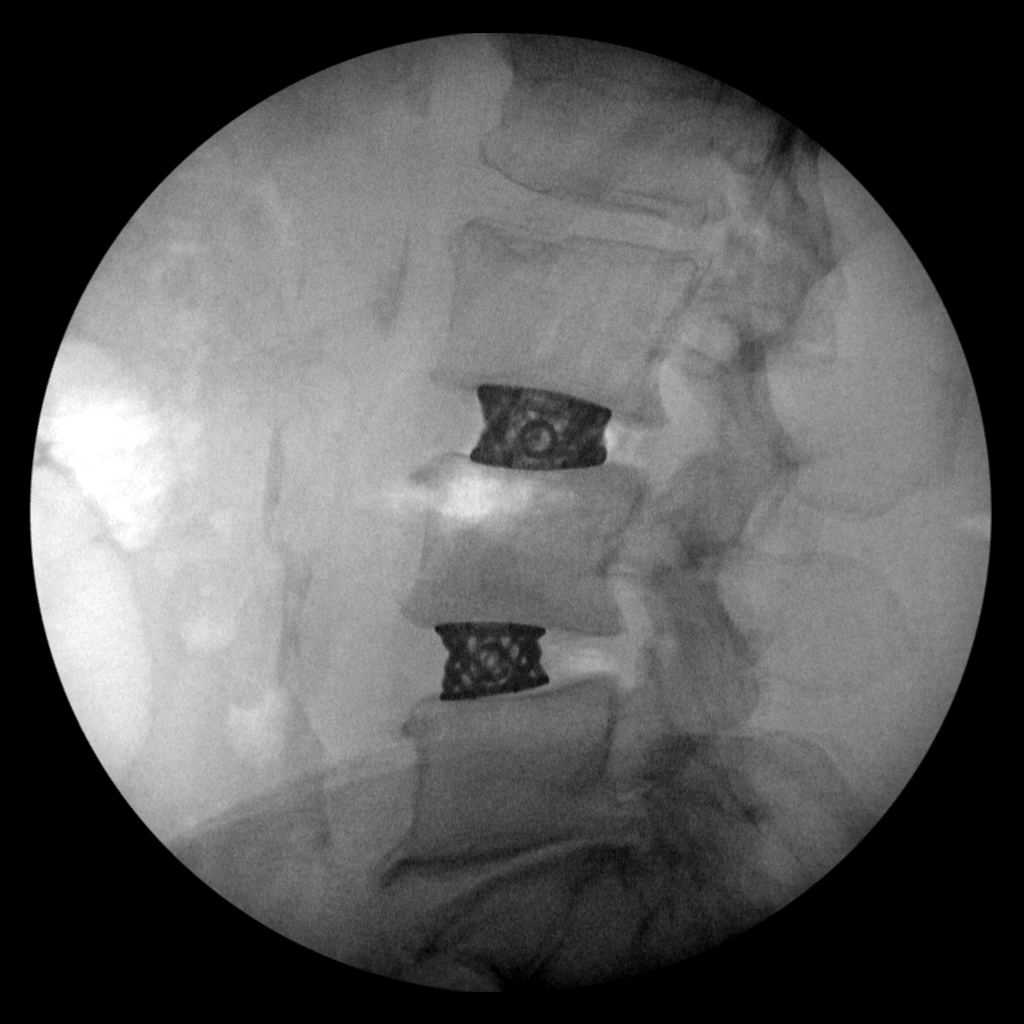
[im 2/4]
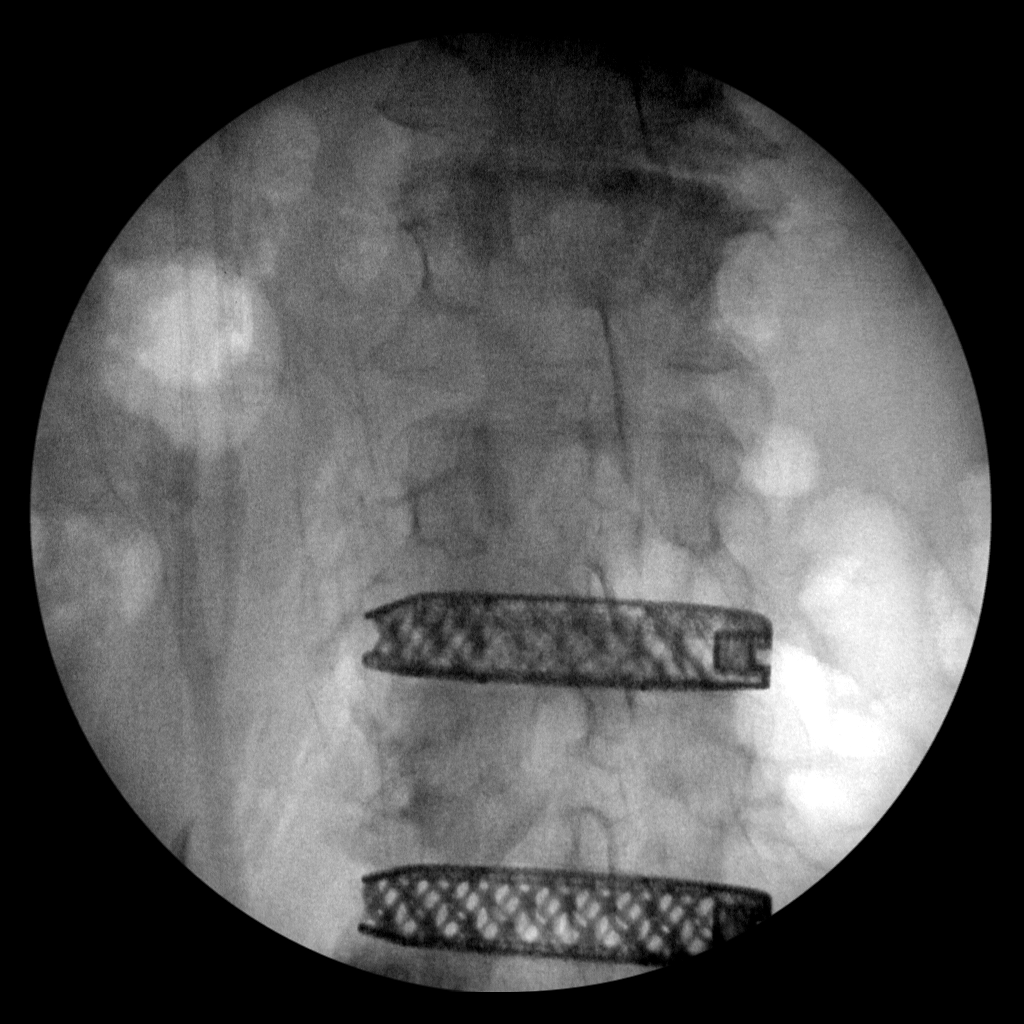
[im 3/4]
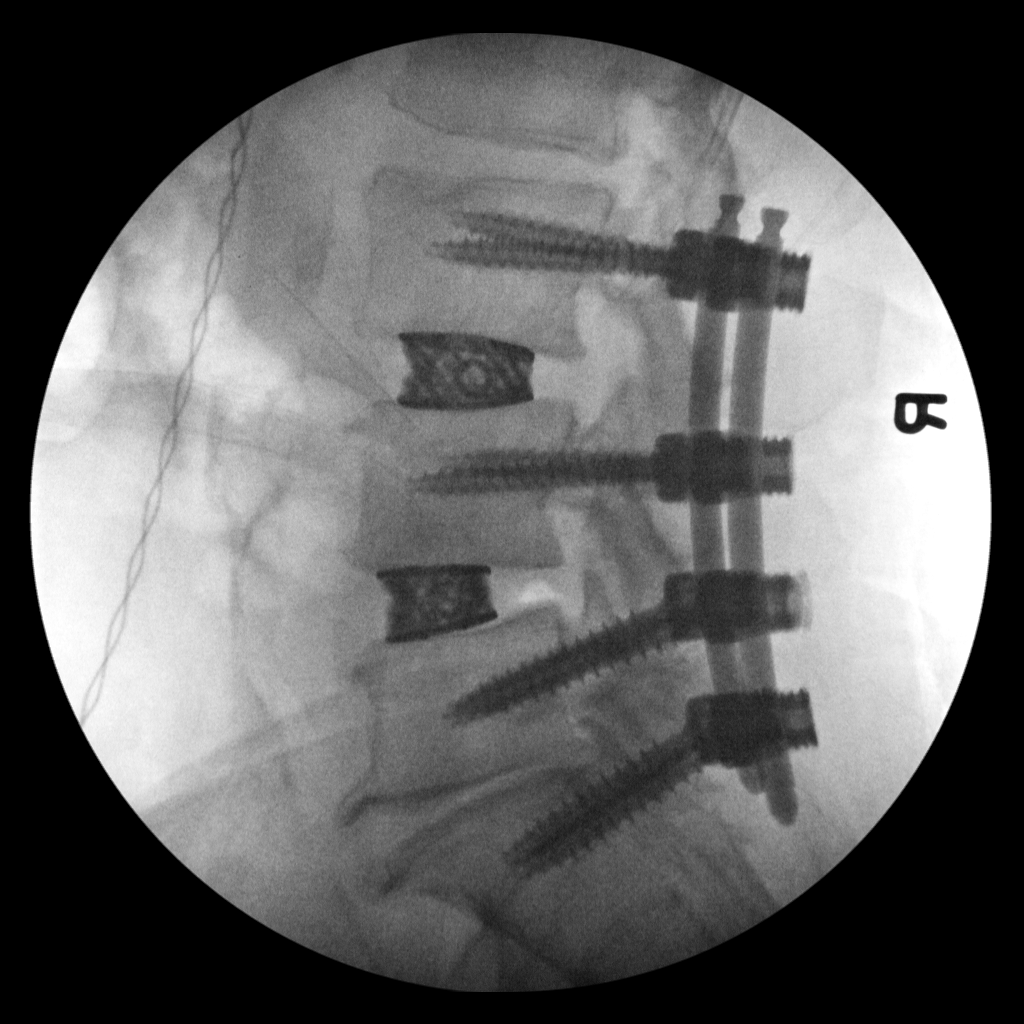
[im 4/4]
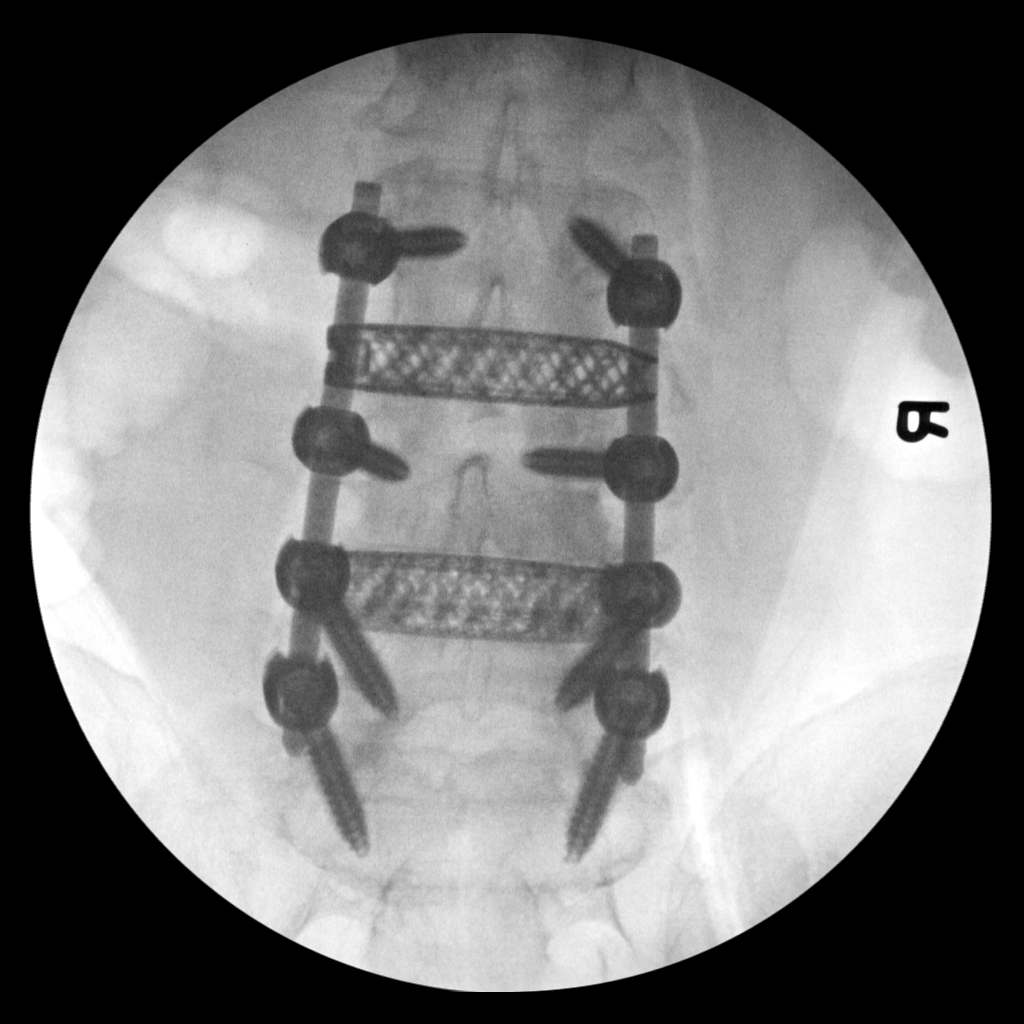

[4 of 4 positions shown; findings below may reference images not displayed]

FINDINGS: Four intraoperative fluoroscopic images were obtained of the lower
lumbar spine. These demonstrate the patient be status post surgical
posterior fusion extending from L3-S1 with bilateral intrapedicular
screw placement. Patient is also status post interbody fusion of
L3-4 and L5-S1. Good alignment of vertebral bodies is noted.
Atherosclerosis of thoracic aorta is noted.
IMPRESSION: Status post surgical fusion extending from L3-S1.
